# Patient Record
Sex: Female | Born: 1965
Health system: Southern US, Community
[De-identification: ages and names within clinical notes are randomized; demographics above are authoritative.]

## PROBLEM LIST (undated history)

## (undated) DIAGNOSIS — R519 Headache, unspecified: Secondary | ICD-10-CM

## (undated) DIAGNOSIS — G43909 Migraine, unspecified, not intractable, without status migrainosus: Secondary | ICD-10-CM

## (undated) DIAGNOSIS — F329 Major depressive disorder, single episode, unspecified: Secondary | ICD-10-CM

## (undated) DIAGNOSIS — R51 Headache: Secondary | ICD-10-CM

## (undated) DIAGNOSIS — F32A Depression, unspecified: Secondary | ICD-10-CM

## (undated) HISTORY — DX: Headache: R51

## (undated) HISTORY — PX: OTHER SURGICAL HISTORY: SHX169

## (undated) HISTORY — DX: Depression, unspecified: F32.A

## (undated) HISTORY — DX: Major depressive disorder, single episode, unspecified: F32.9

## (undated) HISTORY — DX: Migraine, unspecified, not intractable, without status migrainosus: G43.909

## (undated) HISTORY — DX: Headache, unspecified: R51.9

---

## 2016-04-21 ENCOUNTER — Ambulatory Visit: Payer: Self-pay | Admitting: Family Medicine

## 2016-05-02 ENCOUNTER — Encounter: Payer: Self-pay | Admitting: Family Medicine

## 2016-05-02 ENCOUNTER — Ambulatory Visit (INDEPENDENT_AMBULATORY_CARE_PROVIDER_SITE_OTHER): Payer: PRIVATE HEALTH INSURANCE | Admitting: Family Medicine

## 2016-05-02 VITALS — BP 112/86 | HR 64 | Temp 98.4°F | Ht 64.0 in | Wt 134.4 lb

## 2016-05-02 DIAGNOSIS — G43809 Other migraine, not intractable, without status migrainosus: Secondary | ICD-10-CM | POA: Diagnosis not present

## 2016-05-02 DIAGNOSIS — R590 Localized enlarged lymph nodes: Secondary | ICD-10-CM | POA: Diagnosis not present

## 2016-05-02 DIAGNOSIS — Z7689 Persons encountering health services in other specified circumstances: Secondary | ICD-10-CM | POA: Diagnosis not present

## 2016-05-02 DIAGNOSIS — Z8659 Personal history of other mental and behavioral disorders: Secondary | ICD-10-CM | POA: Diagnosis not present

## 2016-05-02 NOTE — Progress Notes (Signed)
Patient ID: Jill Price, female   DOB: May 07, 1965, 51 y.o.   MRN: [id number]  Patient presents to clinic today to establish care. She would like to schedule for a physical and lab work within one to two weeks.   Chronic Issues:  Migraines:  Intermittent and reports that these have been associated with stress in the past.  She reports using Excedrin migraine and coffee no more than once/month at the most.  History of an aura with seeing "broken glass" with photophobia presents with migraines. She denies a headache at this time but reports that she treated yesterday as she "felt one coming on". She denies any visual changes, numbness, tingling, weakness, N/V, photophobia or phonophobia.  Lymphadenopathy: Cervical lymphadenopathy noted on the right side of her neck which she reports has been present for "many years" She denies any changes in size, tenderness, fever, sweats, night sweats, or unexplained weight loss. She reports a previous work up for this issue. She recently relocated to [facility] from [place] and has requested her medical records to be sent to this office.   History of depression: She denies use of medications and reports that depression symptoms are episodic in nature. She denies depressed or anxious mood today. She does report increased stress due to recent move and looking for employment.   Health Maintenance: Dental -- Every 6 months Vision -- Yearly Immunizations --She has not had a flu vaccine. She declines it today.  Colonoscopy -- Needed Mammogram --She is due; Last one in 12/2014; Normal per patient PAP -- She is due; Last one in 12/2014; Normal per patient  Walks every day for 45 minutes/day without cardiopulmonary symptoms. She avoids dairy and pasta. She follows a modified carbohydrate diet. Caffeine intake 2 cups/day.  Past Medical History:  Diagnosis Date  . Depression   . Frequent headaches   . Migraines     History reviewed. No pertinent surgical history.  No  current outpatient prescriptions on file prior to visit.   No current facility-administered medications on file prior to visit.     No Known Allergies  History reviewed. No pertinent family history.  Social History   Social History  . Marital status: Unknown    Spouse name: N/A  . Number of children: N/A  . Years of education: N/A   Occupational History  . Not on file.   Social History Main Topics  . Smoking status: Never Smoker  . Smokeless tobacco: Never Used  . Alcohol use No  . Drug use: No  . Sexual activity: Yes    Birth control/ protection: None   Other Topics Concern  . Not on file   Social History Narrative  . No narrative on file    Review of Systems  Constitutional: Negative for chills, diaphoresis, fever, malaise/fatigue and weight loss.  Eyes: Negative for blurred vision, double vision and photophobia.  Respiratory: Negative for cough, sputum production and shortness of breath.   Cardiovascular: Negative for chest pain, palpitations, claudication and leg swelling.  Gastrointestinal: Negative for abdominal pain, constipation, diarrhea, heartburn, nausea and vomiting.  Genitourinary: Negative for dysuria, hematuria and urgency.  Musculoskeletal: Negative for myalgias.  Skin: Negative for rash.  Neurological: Negative for dizziness, tingling and headaches.  Psychiatric/Behavioral:       Denies depressed or anxious mood today   Past Medical History:  Diagnosis Date  . Depression   . Frequent headaches   . Migraines      Social History   Social History  . Marital  status: Unknown    Spouse name: N/A  . Number of children: N/A  . Years of education: N/A   Occupational History  . Not on file.   Social History Main Topics  . Smoking status: Never Smoker  . Smokeless tobacco: Never Used  . Alcohol use No  . Drug use: No  . Sexual activity: Yes    Birth control/ protection: None   Other Topics Concern  . 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 No pertinent surgical history.  Family History  Problem Relation Age of Onset  . Heart disease Father   . Dementia Father     No Known Allergies  No current outpatient prescriptions on file prior to visit.   No current facility-administered medications on file prior to visit.     BP 112/86 (BP Location: Left Arm, Patient Position: Sitting, Cuff Size: Normal)   Pulse 64   Temp 98.4 F (36.9 C) (Oral)   Ht 5\' 4"  (1.626 m)   Wt 134 lb 6.4 oz (61 kg)   LMP 04/26/2016 (Exact Date)   SpO2 99%   BMI 23.07 kg/m      BP 112/86 (BP Location: Left Arm, Patient Position: Sitting, Cuff Size: Normal)   Pulse 64   Temp 98.4 F (36.9 C) (Oral)   Ht 5\' 4"  (1.626 m)   Wt 134 lb 6.4 oz (61 kg)   LMP 04/26/2016 (Exact Date)   SpO2 99%   BMI 23.07 kg/m   Physical Exam  Constitutional: She is oriented to person, place, and time and well-developed, well-nourished, and in no distress.  HENT:  Head: Normocephalic.  Right Ear: Tympanic membrane normal.  Left Ear: Tympanic membrane normal.  Nose: No rhinorrhea. Right sinus exhibits no maxillary sinus tenderness and no frontal sinus tenderness. Left sinus exhibits no maxillary sinus tenderness and no frontal sinus tenderness.  Mouth/Throat: Oropharynx is clear and moist and mucous membranes are normal. No oropharyngeal exudate or posterior oropharyngeal erythema.  Eyes: Pupils are equal, round, and reactive to light. No scleral icterus.  Neck: Neck supple.  Non tender, movable, right sided anterior cervical lymph node approximately 2 cm x 2cm  Cardiovascular: Normal rate, regular rhythm and intact distal pulses.   Pulmonary/Chest: Effort normal and breath sounds normal. She has no wheezes. She has no rales.  Abdominal: Soft. Bowel sounds are normal. There is no tenderness.  Lymphadenopathy:    She has cervical adenopathy.  Neurological: She is alert and  oriented to person, place, and time. Coordination normal.  II-Visual fields grossly intact. III/IV/VI-Extraocular movements intact. Pupils reactive bilaterally. V/VII-Smile symmetric, equal eyebrow raise, facial sensation intact VIII- Hearing grossly intact Ambulates with a coordinated gait   Skin: Skin is warm and dry. No rash noted.  Psychiatric: Mood, memory, affect and judgment normal.    Assessment/Plan: 1. Lymphadenopathy, cervical History of cervical adenopathy per patient; will return for lab work; no history of unexplained weight loss or night sweats. Will monitor at follow up in one week with physical and lab work.We discussed [place] of lymph node and patient stated she will obtain medical records from [place] where she was evaluated for this and bring records with her at her physical and lab work next week.  2. Other migraine without status migrainosus, not intractable Controlled; Migraines are episodic in nature and are managed with excedrin migraine. We discussed the importance of follow up if migraines are  occurring more frequently and if she is treating these 10 episodes a month or if new symptoms appear such as neurological changes.   3. History of depression Controlled; Episodic in nature; No history of medication for treatment. She prefers counseling for treatment.  4. Encounter to establish care We reviewed the PMH, PSH, FH, SH, Meds and Allergies. -We provided refills for any medications we will prescribe as needed. -We addressed current concerns per orders and patient instructions. -We have asked for records for pertinent exams, studies, vaccines and notes from previous providers. -We have advised patient to follow up per instructions below.    Advised follow up for a physical and lab work in one week with copies of medical records.  We also discussed preventive screenings that are recommended such as colon and skin cancer screenings. Follow up with physical. She voiced  understanding and agreed with plan.  Delano Metz, FNP-C

## 2016-05-02 NOTE — Patient Instructions (Signed)
It was a pleasure to meet you today! Please follow up for blood work and physical at your convenience.

## 2016-05-02 NOTE — Progress Notes (Signed)
Pre visit review using our clinic review tool, if applicable. No additional management support is needed unless otherwise documented below in the visit note. 

## 2016-05-06 ENCOUNTER — Other Ambulatory Visit (INDEPENDENT_AMBULATORY_CARE_PROVIDER_SITE_OTHER): Payer: PRIVATE HEALTH INSURANCE

## 2016-05-06 DIAGNOSIS — Z0000 Encounter for general adult medical examination without abnormal findings: Secondary | ICD-10-CM | POA: Diagnosis not present

## 2016-05-06 LAB — CBC WITH DIFFERENTIAL/PLATELET
Basophils Absolute: 0.1 K/uL (ref 0.0–0.1)
Basophils Relative: 0.9 % (ref 0.0–3.0)
Eosinophils Absolute: 0.4 K/uL (ref 0.0–0.7)
Eosinophils Relative: 6.5 % — ABNORMAL HIGH (ref 0.0–5.0)
HCT: 38.6 % (ref 36.0–46.0)
Hemoglobin: 13 g/dL (ref 12.0–15.0)
Lymphocytes Relative: 29 % (ref 12.0–46.0)
Lymphs Abs: 1.7 K/uL (ref 0.7–4.0)
MCHC: 33.8 g/dL (ref 30.0–36.0)
MCV: 88 fl (ref 78.0–100.0)
Monocytes Absolute: 0.4 K/uL (ref 0.1–1.0)
Monocytes Relative: 7.1 % (ref 3.0–12.0)
Neutro Abs: 3.3 K/uL (ref 1.4–7.7)
Neutrophils Relative %: 56.5 % (ref 43.0–77.0)
Platelets: 246 K/uL (ref 150.0–400.0)
RBC: 4.39 Mil/uL (ref 3.87–5.11)
RDW: 13.2 % (ref 11.5–15.5)
WBC: 5.9 K/uL (ref 4.0–10.5)

## 2016-05-06 LAB — HEPATIC FUNCTION PANEL
ALK PHOS: 64 U/L (ref 39–117)
ALT: 15 U/L (ref 0–35)
AST: 14 U/L (ref 0–37)
Albumin: 4.2 g/dL (ref 3.5–5.2)
BILIRUBIN DIRECT: 0.2 mg/dL (ref 0.0–0.3)
TOTAL PROTEIN: 6.9 g/dL (ref 6.0–8.3)
Total Bilirubin: 1 mg/dL (ref 0.2–1.2)

## 2016-05-06 LAB — LIPID PANEL
Cholesterol: 194 mg/dL (ref 0–200)
HDL: 79.8 mg/dL
LDL Cholesterol: 102 mg/dL — ABNORMAL HIGH (ref 0–99)
NonHDL: 114.05
Total CHOL/HDL Ratio: 2
Triglycerides: 61 mg/dL (ref 0.0–149.0)
VLDL: 12.2 mg/dL (ref 0.0–40.0)

## 2016-05-06 LAB — BASIC METABOLIC PANEL
BUN: 13 mg/dL (ref 6–23)
CALCIUM: 9.1 mg/dL (ref 8.4–10.5)
CO2: 28 mEq/L (ref 19–32)
CREATININE: 0.69 mg/dL (ref 0.40–1.20)
Chloride: 104 mEq/L (ref 96–112)
GFR: 94.86 mL/min (ref >60.00)
Glucose, Bld: 88 mg/dL (ref 70–99)
Potassium: 4.4 mEq/L (ref 3.5–5.1)
Sodium: 137 mEq/L (ref 135–145)

## 2016-05-06 LAB — POC URINALSYSI DIPSTICK (AUTOMATED)
Bilirubin, UA: NEGATIVE
Blood, UA: NEGATIVE
Glucose, UA: NEGATIVE
KETONES UA: NEGATIVE
LEUKOCYTES UA: NEGATIVE
Nitrite, UA: NEGATIVE
PROTEIN UA: NEGATIVE
Spec Grav, UA: 1.02
UROBILINOGEN UA: 0.2
pH, UA: 6

## 2016-05-06 LAB — TSH: TSH: 1.22 u[IU]/mL (ref 0.35–4.50)

## 2016-05-12 ENCOUNTER — Encounter: Payer: Self-pay | Admitting: Family Medicine

## 2016-05-12 ENCOUNTER — Ambulatory Visit (INDEPENDENT_AMBULATORY_CARE_PROVIDER_SITE_OTHER): Payer: PRIVATE HEALTH INSURANCE | Admitting: Family Medicine

## 2016-05-12 ENCOUNTER — Other Ambulatory Visit (HOSPITAL_COMMUNITY)
Admission: RE | Admit: 2016-05-12 | Discharge: 2016-05-12 | Disposition: A | Discharge status: Home / Self Care | Payer: 59 | Source: Ambulatory Visit | Attending: Family Medicine | Admitting: Family Medicine

## 2016-05-12 ENCOUNTER — Telehealth: Payer: Self-pay | Admitting: Family Medicine

## 2016-05-12 DIAGNOSIS — Z01419 Encounter for gynecological examination (general) (routine) without abnormal findings: Secondary | ICD-10-CM | POA: Insufficient documentation

## 2016-05-12 DIAGNOSIS — Z1283 Encounter for screening for malignant neoplasm of skin: Secondary | ICD-10-CM

## 2016-05-12 DIAGNOSIS — Z0000 Encounter for general adult medical examination without abnormal findings: Secondary | ICD-10-CM

## 2016-05-12 DIAGNOSIS — R590 Localized enlarged lymph nodes: Secondary | ICD-10-CM

## 2016-05-12 NOTE — Progress Notes (Signed)
Pre visit review using our clinic review tool, if applicable. No additional management support is needed unless otherwise documented below in the visit note. 

## 2016-05-12 NOTE — Telephone Encounter (Signed)
Pt returning your call. Please call back °

## 2016-05-12 NOTE — Telephone Encounter (Signed)
Spoke with pt and she was in a meeting. She asked for call back on Friday.

## 2016-05-12 NOTE — Patient Instructions (Signed)
It was a pleasure to see you today! A referral to dermatology has been placed for you and please contact office in 10 working days if you have not heard from our office for an appointment.  We recommend the following healthy lifestyle measures: - eat a healthy whole foods diet consisting of regular small meals composed of vegetables, fruits, beans, nuts, seeds, healthy meats such as white chicken and fish and whole grains.  - avoid sweets, white starchy foods, fried foods, fast food, processed foods, sodas, red meet and other fattening foods.  - get a least 150-300 minutes of aerobic exercise per week.   Health Maintenance, Female Introduction Adopting a healthy lifestyle and getting preventive care can go a long way to promote health and wellness. Talk with your health care provider about what schedule of regular examinations is right for you. This is a good chance for you to check in with your provider about disease prevention and staying healthy. In between checkups, there are plenty of things you can do on your own. Experts have done a lot of research about which lifestyle changes and preventive measures are most likely to keep you healthy. Ask your health care provider for more information. Weight and diet Eat a healthy diet  Be sure to include plenty of vegetables, fruits, low-fat dairy products, and lean protein.  Do not eat a lot of foods high in solid fats, added sugars, or salt.  Get regular exercise. This is one of the most important things you can do for your health.  Most adults should exercise for at least 150 minutes each week. The exercise should increase your heart rate and make you sweat (moderate-intensity exercise).  Most adults should also do strengthening exercises at least twice a week. This is in addition to the moderate-intensity exercise. Maintain a healthy weight  Body mass index (BMI) is a measurement that can be used to identify possible weight problems. It  estimates body fat based on height and weight. Your health care provider can help determine your BMI and help you achieve or maintain a healthy weight.  For females 20 years of age and older:  A BMI below 18.5 is considered underweight.  A BMI of 18.5 to 24.9 is normal.  A BMI of 25 to 29.9 is considered overweight.  A BMI of 30 and above is considered obese. Watch levels of cholesterol and blood lipids  You should start having your blood tested for lipids and cholesterol at 53 years of age, then have this test every 5 years.  You may need to have your cholesterol levels checked more often if:  Your lipid or cholesterol levels are high.  You are older than 53 years of age.  You are at high risk for heart disease. Cancer screening Lung Cancer  Lung cancer screening is recommended for adults 55-80 years old who are at high risk for lung cancer because of a history of smoking.  A yearly low-dose CT scan of the lungs is recommended for people who:  Currently smoke.  Have quit within the past 15 years.  Have at least a 30-pack-year history of smoking. A pack year is smoking an average of one pack of cigarettes a day for 1 year.  Yearly screening should continue until it has been 15 years since you quit.  Yearly screening should stop if you develop a health problem that would prevent you from having lung cancer treatment. Breast Cancer  Practice breast self-awareness. This means understanding how your   breasts normally appear and feel.  It also means doing regular breast self-exams. Let your health care provider know about any changes, no matter how small.  If you are in your 20s or 30s, you should have a clinical breast exam (CBE) by a health care provider every 1-3 years as part of a regular health exam.  If you are 53 or older, have a CBE every year. Also consider having a breast X-ray (mammogram) every year.  If you have a family history of breast cancer, talk to your  health care provider about genetic screening.  If you are at high risk for breast cancer, talk to your health care provider about having an MRI and a mammogram every year.  Breast cancer gene (BRCA) assessment is recommended for women who have family members with BRCA-related cancers. BRCA-related cancers include:  Breast.  Ovarian.  Tubal.  Peritoneal cancers.  Results of the assessment will determine the need for genetic counseling and BRCA1 and BRCA2 testing. Cervical Cancer  Your health care provider may recommend that you be screened regularly for cancer of the pelvic organs (ovaries, uterus, and vagina). This screening involves a pelvic examination, including checking for microscopic changes to the surface of your cervix (Pap test). You may be encouraged to have this screening done every 3 years, beginning at age 56.  For women ages 102-65, health care providers may recommend pelvic exams and Pap testing every 3 years, or they may recommend the Pap and pelvic exam, combined with testing for human papilloma virus (HPV), every 5 years. Some types of HPV increase your risk of cervical cancer. Testing for HPV may also be done on women of any age with unclear Pap test results.  Other health care providers may not recommend any screening for nonpregnant women who are considered low risk for pelvic cancer and who do not have symptoms. Ask your health care provider if a screening pelvic exam is right for you.  If you have had past treatment for cervical cancer or a condition that could lead to cancer, you need Pap tests and screening for cancer for at least 20 years after your treatment. If Pap tests have been discontinued, your risk factors (such as having a new sexual partner) need to be reassessed to determine if screening should resume. Some women have medical problems that increase the chance of getting cervical cancer. In these cases, your health care provider may recommend more frequent  screening and Pap tests. Colorectal Cancer  This type of cancer can be detected and often prevented.  Routine colorectal cancer screening usually begins at 53 years of age and continues through 53 years of age.  Your health care provider may recommend screening at an earlier age if you have risk factors for colon cancer.  Your health care provider may also recommend using home test kits to check for hidden blood in the stool.  A small camera at the end of a tube can be used to examine your colon directly (sigmoidoscopy or colonoscopy). This is done to check for the earliest forms of colorectal cancer.  Routine screening usually begins at age 61.  Direct examination of the colon should be repeated every 5-10 years through 53 years of age. However, you may need to be screened more often if early forms of precancerous polyps or small growths are found. Skin Cancer  Check your skin from head to toe regularly.  Tell your health care provider about any new moles or changes in moles, especially  if there is a change in a mole's shape or color.  Also tell your health care provider if you have a mole that is larger than the size of a pencil eraser.  Always use sunscreen. Apply sunscreen liberally and repeatedly throughout the day.  Protect yourself by wearing long sleeves, pants, a wide-brimmed hat, and sunglasses whenever you are outside. Heart disease, diabetes, and high blood pressure  High blood pressure causes heart disease and increases the risk of stroke. High blood pressure is more likely to develop in:  People who have blood pressure in the high end of the normal range (130-139/85-89 mm Hg).  People who are overweight or obese.  People who are African American.  If you are 18-39 years of age, have your blood pressure checked every 3-5 years. If you are 40 years of age or older, have your blood pressure checked every year. You should have your blood pressure measured twice-once  when you are at a hospital or clinic, and once when you are not at a hospital or clinic. Record the average of the two measurements. To check your blood pressure when you are not at a hospital or clinic, you can use:  An automated blood pressure machine at a pharmacy.  A home blood pressure monitor.  If you are between 55 years and 79 years old, ask your health care provider if you should take aspirin to prevent strokes.  Have regular diabetes screenings. This involves taking a blood sample to check your fasting blood sugar level.  If you are at a normal weight and have a low risk for diabetes, have this test once every three years after 53 years of age.  If you are overweight and have a high risk for diabetes, consider being tested at a younger age or more often. Preventing infection Hepatitis B  If you have a higher risk for hepatitis B, you should be screened for this virus. You are considered at high risk for hepatitis B if:  You were born in a country where hepatitis B is common. Ask your health care provider which countries are considered high risk.  Your parents were born in a high-risk country, and you have not been immunized against hepatitis B (hepatitis B vaccine).  You have HIV or AIDS.  You use needles to inject street drugs.  You live with someone who has hepatitis B.  You have had sex with someone who has hepatitis B.  You get hemodialysis treatment.  You take certain medicines for conditions, including cancer, organ transplantation, and autoimmune conditions. Hepatitis C  Blood testing is recommended for:  Everyone born from 1945 through 1965.  Anyone with known risk factors for hepatitis C. Sexually transmitted infections (STIs)  You should be screened for sexually transmitted infections (STIs) including gonorrhea and chlamydia if:  You are sexually active and are younger than 53 years of age.  You are older than 53 years of age and your health care  provider tells you that you are at risk for this type of infection.  Your sexual activity has changed since you were last screened and you are at an increased risk for chlamydia or gonorrhea. Ask your health care provider if you are at risk.  If you do not have HIV, but are at risk, it may be recommended that you take a prescription medicine daily to prevent HIV infection. This is called pre-exposure prophylaxis (PrEP). You are considered at risk if:  You are sexually active and do not   regularly use condoms or know the HIV status of your partner(s).  You take drugs by injection.  You are sexually active with a partner who has HIV. Talk with your health care provider about whether you are at high risk of being infected with HIV. If you choose to begin PrEP, you should first be tested for HIV. You should then be tested every 3 months for as long as you are taking PrEP. Pregnancy  If you are premenopausal and you may become pregnant, ask your health care provider about preconception counseling.  If you may become pregnant, take 400 to 800 micrograms (mcg) of folic acid every day.  If you want to prevent pregnancy, talk to your health care provider about birth control (contraception). Osteoporosis and menopause  Osteoporosis is a disease in which the bones lose minerals and strength with aging. This can result in serious bone fractures. Your risk for osteoporosis can be identified using a bone density scan.  If you are 65 years of age or older, or if you are at risk for osteoporosis and fractures, ask your health care provider if you should be screened.  Ask your health care provider whether you should take a calcium or vitamin D supplement to lower your risk for osteoporosis.  Menopause may have certain physical symptoms and risks.  Hormone replacement therapy may reduce some of these symptoms and risks. Talk to your health care provider about whether hormone replacement therapy is right  for you. Follow these instructions at home:  Schedule regular health, dental, and eye exams.  Stay current with your immunizations.  Do not use any tobacco products including cigarettes, chewing tobacco, or electronic cigarettes.  If you are pregnant, do not drink alcohol.  If you are breastfeeding, limit how much and how often you drink alcohol.  Limit alcohol intake to no more than 1 drink per day for nonpregnant women. One drink equals 12 ounces of beer, 5 ounces of wine, or 1 ounces of hard liquor.  Do not use street drugs.  Do not share needles.  Ask your health care provider for help if you need support or information about quitting drugs.  Tell your health care provider if you often feel depressed.  Tell your health care provider if you have ever been abused or do not feel safe at home. This information is not intended to replace advice given to you by your health care provider. Make sure you discuss any questions you have with your health care provider. Document Released: 10/04/2010 Document Revised: 08/27/2015 Document Reviewed: 12/23/2014  2017 [employer]  

## 2016-05-12 NOTE — Progress Notes (Signed)
Subjective:    Patient ID: Jill Price, female    DOB: December 16, 1965, 51 y.o.   MRN: [id number]  HPI  Jill Price is a 51 year old female who is a nonsmoker and presents today for routine care.  Cervical lymphadenopathy:  History of right cervical adenopathy per patient; CBC WNL today; no history of unexplained weight loss or night sweats.  She states that this has been present for at least a decade and no changes have been noted.  She declines further testing of [place] and states that she will obtain her medical records from [place] as she has moved here recently.   History of migraine:  Controlled.  She denies recent HA and she has not had one since last visit at this office.  Denies depressed or anxious mood.  No history of medication for treatment. She prefers counseling for treatment if needed. PHQ-2:  O    Review of Systems Constitutional: No fever, chills, significant weight change, fatigue, weakness or night sweats Eyes: No redness, discharge, pain, blurred vision, double vision, or loss of vision ENT/mouth: No nasal congestion, postnasal drainage,epistaxis, purulent discharge, earache, hearing loss, tinnitus ,sore throat , dental pain, or hoarseness   Cardiovascular: no chest pain, palpitations, racing, irregular rhythm, syncope, nausea, sweating, claudication, or edema  Respiratory: No cough, sputum production,hemoptysis,  dyspnea, paroxysmal nocturnal dyspnea, pleuritic chest pain, significant snoring, or  apnea    Gastrointestinal: No heartburn,dysphagia, nausea and vomiting,ominal pain, change in bowels, anorexia, diarrhea, significant constipation, rectal bleeding, melena,  stool incontinence or jaundice Genitourinary: No dysuria,hematuria, pyuria, frequency, urgency,  incontinence, nocturia, dark urine or flank pain Musculoskeletal: No myalgias or muscle cramping, joint stiffness, joint swelling, joint color change, weakness, or cyanosis Dermatologic: No rash, pruritus,  urticaria, or change in color or temperature of skin Neurologic: No headache, vertigo, limb weakness, tremor, gait disturbance, seizures, memory loss, numbness or tingling Psychiatric: No significant anxiety or depression, anhedonia, panic attacks, insomnia, or anorexia Endocrine: No change in hair/skin/ nails, excessive thirst, excessive hunger, excessive urination, or unexplained fatigue Hematologic/lymphatic: No bruising, lymphadenopathy,or  abnormal clotting Allergy/immunology: No itchy/ watery eyes, abnormal sneezing, rhinitis, urticaria ,or angioedema     Objective:   Physical Exam Physical Exam  Constitutional: She is oriented to person, place, and time. She appears well-developed and well-nourished. No distress.  HENT:  Head: Normocephalic and atraumatic.  Right Ear: Tympanic membrane and ear canal normal.  Left Ear: Tympanic membrane and ear canal normal.  Mouth/Throat: Oropharynx is clear and moist.  Eyes: Pupils are equal, round, and reactive to light. No scleral icterus.  Neck: Normal range of motion. No thyromegaly present.  Cardiovascular: Normal rate and regular rhythm.   No murmur heard. Pulmonary/Chest: Effort normal and breath sounds normal. No respiratory distress. He has no wheezes. She has no rales. She exhibits no tenderness.  Abdominal: Soft. Bowel sounds are normal. She exhibits no distension and no mass. There is no tenderness. There is no rebound and no guarding.  Musculoskeletal: She exhibits no edema.  Lymphadenopathy:    Non tender, movable right anterior cervical lymph node approximately 2 cm x 34m. No change from previous visit.  Neurological: She is alert and oriented to person, place, and time. She has normal patellar reflexes. She exhibits normal muscle tone. Coordination normal.  Skin: Skin is warm and dry.  Psychiatric: She has a normal mood and affect. Her behavior is normal. Judgment and thought content normal.  Breasts: Examined lying Right: Without  masses, retractions, discharge or  axillary adenopathy.  Left: Without masses, retractions, discharge or axillary adenopathy.  Inguinal/mons: Normal without inguinal adenopathy  External genitalia: Normal  BUS/Urethra/Skene's glands: Normal  Bladder: Normal  Vagina: Normal  Cervix: Normal  Uterus: normal in size, shape and contour. Midline and mobile  Adnexa/parametria:  Rt: Without masses or tenderness.  Lt: Without masses or tenderness.  Anus and perineum: Normal      Assessment & Plan:  1. Routine general medical examination at a health care facility 51 y.o. female presenting for annual physical.  Health Maintenance counseling: 1. Anticipatory guidance: Patient counseled regarding regular dental exams, eye exams, wearing seatbelts.  2. Risk factor reduction:  Advised patient of need for regular exercise and diet rich and fruits and vegetables to reduce risk of heart attack and stroke.  3. Immunizations/screenings/ancillary studies: Patient declines vaccines. Declined influenza and tetanus today.  There is no immunization history on file for this patient. Health Maintenance Due  Topic Date Due  . Hepatitis C Screening  June 28, 1965  . HIV Screening  02/02/1979  . TETANUS/TDAP  02/02/1983  . PAP SMEAR  02/01/1985  . COLONOSCOPY  02/01/2014   4. Cervical cancer screening- PAP completed today; Last PAP normal per patient; she is having records sent to this office. 5. Breast cancer screening-  breast exam today: No masses or abnormal findings today;  and mammogram: 04/2016: no evidence of malignancy. Recommendation: Yearly return 6. Colon cancer screening - Discussed and recommended cologard for screening. Patient will consider this option. 7. Skin cancer screening- Referral to dermatology per patient request. Limited skin exam including arms, legs, back, face, and scalp completed   2. Screening for skin cancer Patient requested referral to dermatology; moved here from [place] and she has  consistently been followed by dermatology for yearly checks. - Ambulatory referral to Dermatology  3. LAD (lymphadenopathy) of right cervical region History cervical adenopathy per patient; Exam and history are reassuring; She declines further testing for evaluation. We discussed the importance of monitoring for any changes, unexplained weight loss, fever, chills, sweats.  Patient has requested information for a chiropractor for back adjustments. She does not have back pain or concerns. We discussed options for treatment if she develops back pain such as PT or sports medicine referral. Nicoletta Dress is an option for chiropractic care as she is seeking this type of care.  Follow up in one year or sooner.  Delano Metz, FNP-C

## 2016-05-13 LAB — CYTOLOGY - PAP
ADEQUACY: ABSENT
DIAGNOSIS: NEGATIVE

## 2016-05-13 NOTE — Telephone Encounter (Signed)
Patient returned the call in the office and was provided with information per Gregary Signs and verbalized understanding. Patient  requested to have the information about Colon cancer mailed to her which was done.

## 2016-06-02 ENCOUNTER — Other Ambulatory Visit: Payer: Self-pay

## 2016-06-02 DIAGNOSIS — Z1239 Encounter for other screening for malignant neoplasm of breast: Secondary | ICD-10-CM

## 2016-06-08 ENCOUNTER — Other Ambulatory Visit: Payer: Self-pay | Admitting: Family Medicine

## 2016-06-08 DIAGNOSIS — Z1231 Encounter for screening mammogram for malignant neoplasm of breast: Secondary | ICD-10-CM

## 2016-06-27 ENCOUNTER — Ambulatory Visit: Payer: PRIVATE HEALTH INSURANCE

## 2016-07-14 ENCOUNTER — Ambulatory Visit
Admission: RE | Admit: 2016-07-14 | Discharge: 2016-07-14 | Disposition: A | Discharge status: Home / Self Care | Payer: BC Managed Care – PPO | Source: Ambulatory Visit | Attending: Family Medicine | Admitting: Family Medicine

## 2016-07-14 DIAGNOSIS — Z1231 Encounter for screening mammogram for malignant neoplasm of breast: Secondary | ICD-10-CM

## 2016-09-28 ENCOUNTER — Telehealth: Payer: Self-pay | Admitting: Family Medicine

## 2016-09-28 NOTE — Telephone Encounter (Signed)
° ° °  Pt would like a call back concerning her health and would like a call back. She said she prefer to speak with [facility] [phone]

## 2016-09-29 ENCOUNTER — Ambulatory Visit (INDEPENDENT_AMBULATORY_CARE_PROVIDER_SITE_OTHER): Payer: BC Managed Care – PPO | Admitting: Family Medicine

## 2016-09-29 ENCOUNTER — Encounter: Payer: Self-pay | Admitting: Family Medicine

## 2016-09-29 VITALS — BP 110/78 | HR 88 | Temp 98.4°F | Wt 135.8 lb

## 2016-09-29 DIAGNOSIS — Z7251 High risk heterosexual behavior: Secondary | ICD-10-CM

## 2016-09-29 DIAGNOSIS — N951 Menopausal and female climacteric states: Secondary | ICD-10-CM | POA: Diagnosis not present

## 2016-09-29 LAB — POCT URINE PREGNANCY: Preg Test, Ur: NEGATIVE

## 2016-09-29 NOTE — Progress Notes (Signed)
Subjective:    Patient ID: Jill Price, female    DOB: [birth year], 51 y.o.   MRN: [id number]  HPI  Ms. Jill Price is a 51 year old female who is here today to be evaluated for possible pregnancy. She reports having intercourse over 2 weeks ago and noted a condom failure. She is very concerned, anxious, and is teary due this incident. Her last menstrual cycle is at least 4 to 5 months ago. She states she is aware this is "very unlikely" however would like a pregnancy test today. She denies any breast tenderness, vaginal discharge, or frequency of urination.  Partners: 3 total lifetime; female; one recent Prevention: Condom use consistently STI: Condom use consistently; states partner informed her he is negative for STIs Practices: Vaginal intercourse; female partner Past history of STI: None  She state she is not interested in STI screening today as this is not an issue. She also requests having additional testing for hormone levels due to transition to menopause and risk of pregnancy at this stage in her life.  Review of Systems  Constitutional: Negative for chills, fatigue and fever.  Respiratory: Negative for cough, shortness of breath and wheezing.   Cardiovascular: Negative for chest pain and palpitations.  Gastrointestinal: Negative for abdominal pain, constipation, diarrhea, nausea and vomiting.  Genitourinary: Negative for dyspareunia, dysuria, frequency, genital sores, hematuria, pelvic pain, urgency, vaginal bleeding, vaginal discharge and vaginal pain.  Musculoskeletal: Negative for myalgias.  Skin: Negative for rash.  Neurological: Negative for dizziness, weakness, light-headedness and headaches.  Psychiatric/Behavioral:       Denies depressed mood. Anxious today due condom failure   Past Medical History:  Diagnosis Date  . Depression   . Frequent headaches   . Migraines      Social History   Social History  . Marital status: Unknown    Spouse name: N/A  . Number  of children: N/A  . Years of education: N/A   Occupational History  . Not on file.   Social History Main Topics  . Smoking status: Never Smoker  . Smokeless tobacco: Never Used  . Alcohol use No  . Drug use: No  . Sexual activity: Yes    Birth control/ protection: None   Other Topics Concern  . 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  Family History  Problem Relation Age of Onset  . Heart disease Father   . Dementia Father   . Breast cancer Neg Hx     No Known Allergies  No current outpatient prescriptions on file prior to visit.   No current facility-administered medications on file prior to visit.     BP 110/78 (BP Location: Left Arm, Patient Position: Sitting, Cuff Size: Normal)   Pulse 88   Temp 98.4 F (36.9 C) (Oral)   Wt 135 lb 12.8 oz (61.6 kg)   SpO2 99%   BMI 23.31 kg/m       Objective:   Physical Exam  Constitutional: She is oriented to person, place, and time. She appears well-developed and well-nourished.  Eyes: Pupils are equal, round, and reactive to light. No scleral icterus.  Neck: Neck supple.  Cardiovascular: Normal rate, regular rhythm and intact distal pulses.   Pulmonary/Chest: Effort normal and breath sounds normal.  Abdominal: Soft. Bowel sounds are normal. There is no tenderness.  Musculoskeletal: Normal range of motion. She exhibits no edema.  Lymphadenopathy:    She has no cervical adenopathy.  Neurological: She is alert and oriented to person, place, and time.  Skin: Skin is warm and dry. No rash noted.       Assessment & Plan:  1. Unprotected sexual intercourse Will complete pregnancy test today; she declined STI screening today; we discussed that likelihood of pregnancy is quite low however more important concern relates to risk of exposure to STIs. We reviewed importance of consistent condom use and also advised her to consider testing.  She voiced understanding and stated that she will consider this if needed. - POCT urine pregnancy  2. Perimenopause Menstrual cycle changes consistent with transition to menopause. As she is requesting hormonal testing; refer to gynecology for further evaluation and treatment. - Ambulatory referral to Gynecology  Follow up as needed.  Delano Metz, FNP-C

## 2016-09-29 NOTE — Patient Instructions (Signed)
We have ordered labs or studies at this visit. It can take up to 1-2 weeks for results and processing. IF results require follow up or explanation, we will call you with instructions. Clinically stable results will be released to your [facility]. If you have not heard from [place] or cannot find your results in [facility] in 2 weeks please contact our office at [phone].  If you are not yet signed up for [facility], please consider signing up   You will be contact about your referral.  Please let us know if you have not heard back within 1 week about your referral.

## 2016-10-07 ENCOUNTER — Ambulatory Visit: Payer: Self-pay | Admitting: Obstetrics & Gynecology

## 2016-10-10 ENCOUNTER — Ambulatory Visit (INDEPENDENT_AMBULATORY_CARE_PROVIDER_SITE_OTHER): Payer: BC Managed Care – PPO | Admitting: Obstetrics & Gynecology

## 2016-10-10 ENCOUNTER — Encounter: Payer: Self-pay | Admitting: Obstetrics & Gynecology

## 2016-10-10 VITALS — BP 106/70 | Ht 65.0 in | Wt 137.0 lb

## 2016-10-10 DIAGNOSIS — N914 Secondary oligomenorrhea: Secondary | ICD-10-CM

## 2016-10-10 DIAGNOSIS — Z30011 Encounter for initial prescription of contraceptive pills: Secondary | ICD-10-CM | POA: Diagnosis not present

## 2016-10-10 MED ORDER — NORETHINDRONE 0.35 MG PO TABS: 1.0000 | ORAL_TABLET | Freq: Every day | ORAL | 4 refills | Status: DC

## 2016-10-10 NOTE — Progress Notes (Signed)
    Jill Price [birth year] [id number]        51 y.o.  G1P1 Single  RP:  New patient presenting for Oligomenorrhea/Contraception  HPI:  Oligomenorrhea with periods q1-5 months.  LMP normal 10/01/2016.  Sexually active, no current contraception except condoms, with condom failure mid June 2018.  Had a UPT neg 6/282018.  No hot flashes or night sweats.  No vaginal dryness.  Pap normal 05/2016.  Mammo neg 07/2016.  Past medical history,surgical history, problem list, medications, allergies, family history and social history were all reviewed and documented in the EPIC chart.  Directed ROS with pertinent positives and negatives documented in the history of present illness/assessment and plan.  Exam:  Vitals:   10/10/16 1407  Weight: 62.1 kg (137 lb)  Height: 5\' 5"  (1.651 m)   General appearance:  Normal   Assessment/Plan:  51 y.o. G1P1   1. Secondary oligomenorrhea Start Progestin-only BCPs.  F/U Pelvic US for Endometrial line.  Risks associated with Oligo-Ovulation in Perimenopause discussed including the risk of Endometrial Hyperplasia and Endometrial Cancer.   - FSH - TSH - US Transvaginal Non-OB; Future  2. Encounter for initial prescription of contraceptive pills Given her age giving her higher CV risks and lower Fertility, decision to start on Progestin-Pill.  Counseling on above issues >50% x 30 minutes.  [name] MD, 2:12 PM 10/10/2016

## 2016-10-12 ENCOUNTER — Other Ambulatory Visit: Payer: BC Managed Care – PPO

## 2016-10-15 NOTE — Patient Instructions (Signed)
1. Secondary oligomenorrhea Start Progestin-only BCPs.  F/U Pelvic US for Endometrial line.  Risks associated with Oligo-Ovulation in Perimenopause discussed including the risk of Endometrial Hyperplasia and Endometrial Cancer.   - FSH - TSH - US Transvaginal Non-OB; Future  2. Encounter for initial prescription of contraceptive pills Given her age giving her higher CV risks and lower Fertility, decision to start on Progestin-Pill.  Jill Price, it was a pleasure to meet you today!  I will inform you of your results as soon as available.

## 2016-10-27 ENCOUNTER — Ambulatory Visit: Payer: BC Managed Care – PPO | Admitting: Obstetrics & Gynecology

## 2016-10-27 ENCOUNTER — Other Ambulatory Visit: Payer: BC Managed Care – PPO

## 2016-11-15 ENCOUNTER — Telehealth: Payer: Self-pay | Admitting: Family Medicine

## 2016-11-15 NOTE — Telephone Encounter (Signed)
Pt would like to have a call back would not elaborate on the reason.

## 2016-11-17 NOTE — Telephone Encounter (Signed)
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

## 2016-12-20 ENCOUNTER — Other Ambulatory Visit: Payer: BC Managed Care – PPO

## 2016-12-20 ENCOUNTER — Other Ambulatory Visit: Payer: Self-pay | Admitting: Anesthesiology

## 2016-12-20 DIAGNOSIS — N914 Secondary oligomenorrhea: Secondary | ICD-10-CM

## 2016-12-21 LAB — FOLLICLE STIMULATING HORMONE: FSH: 94.2 m[IU]/mL

## 2016-12-21 LAB — TSH: TSH: 1.38 mIU/L

## 2016-12-28 ENCOUNTER — Ambulatory Visit (INDEPENDENT_AMBULATORY_CARE_PROVIDER_SITE_OTHER): Payer: BC Managed Care – PPO | Admitting: Obstetrics & Gynecology

## 2016-12-28 ENCOUNTER — Ambulatory Visit (INDEPENDENT_AMBULATORY_CARE_PROVIDER_SITE_OTHER): Payer: BC Managed Care – PPO

## 2016-12-28 ENCOUNTER — Other Ambulatory Visit: Payer: Self-pay | Admitting: Obstetrics & Gynecology

## 2016-12-28 DIAGNOSIS — D251 Intramural leiomyoma of uterus: Secondary | ICD-10-CM

## 2016-12-28 DIAGNOSIS — N951 Menopausal and female climacteric states: Secondary | ICD-10-CM

## 2016-12-28 DIAGNOSIS — N914 Secondary oligomenorrhea: Secondary | ICD-10-CM

## 2016-12-28 DIAGNOSIS — N930 Postcoital and contact bleeding: Secondary | ICD-10-CM

## 2016-12-28 NOTE — Progress Notes (Signed)
    Jill Price [birth year] [id number]        50 y.o.  G1P1   RP:  Pelvic US for Perimenopausal bleeding  HPI:  Oligomenorrhea with light menses q1-5 months, LMP 09/2016.  Not complaining of hot flashes or night sweats.  Last visit's [place] on 12/20/2016 was 94.2.  Past medical history,surgical history, problem list, medications, allergies, family history and social history were all reviewed and documented in the EPIC chart.  Directed ROS with pertinent positives and negatives documented in the history of present illness/assessment and plan.  Exam:  There were no vitals filed for this visit. General appearance:  Normal   Pelvic US today: T/V Retroverted Uterus with Intramural Fibroids 1.9 x 1.5 cm, 1.4 x 0.9 cm, 0.7 x 1.2 cm, 1.8 x 1.2 cm and 0.8 cm.  Endometrial line thin at 2.3 mm. Right ovary normal with an echo-free follicle 1.9 x 1.2 cm.  Left ovary with a thin-walled follicle with low level echoes, neg CFD 1.3 x 1.3 cm.  No FF in CDS.  Assessment/Plan:  51 y.o. G1P1   1. Perimenopause Oligomenorrhea/PMB.  [place] 12/20/2016 at 94. 2.  Pelvic US today shows a thin reassuring Endometrial line c/w menopause.  Ovaries still showing small follicles.  Probalby just entering Menopause vs still in Perimenopause.  Strongly recommend contraception.  Patient opts for condoms, declines alternatives at this time.  Counseling on above issues >50% x 15 minutes.  [name] MD, 12:38 PM 12/28/2016

## 2016-12-29 NOTE — Patient Instructions (Signed)
1. Perimenopause Oligomenorrhea/PMB.  [place] 12/20/2016 at 94. 2.  Pelvic US today shows a thin reassuring Endometrial line c/w menopause.  Ovaries still showing small follicles.  Probalby just entering Menopause vs still in Perimenopause.  Strongly recommend contraception.  Patient opts for condoms, declines alternatives at this time.  Benjamine Mola, good to see you today!

## 2017-03-09 ENCOUNTER — Telehealth: Payer: Self-pay | Admitting: *Deleted

## 2017-03-09 NOTE — Telephone Encounter (Signed)
Patient had visit with you as a new patient on 10/10/16. Patient forgot to ask for Rx for valacyclovir Rx. Okay to send?

## 2017-03-10 MED ORDER — VALACYCLOVIR HCL 1 G PO TABS: ORAL_TABLET | ORAL | 3 refills | Status: DC

## 2017-03-10 NOTE — Telephone Encounter (Signed)
Pt aware, Rx sent. 

## 2017-03-10 NOTE — Telephone Encounter (Signed)
Yes, agree with Valacyclovir.

## 2017-05-24 LAB — BASIC METABOLIC PANEL
BUN: 15 (ref 4–21)
CREATININE: 0.7 (ref 0.5–1.1)
GLUCOSE: 114
POTASSIUM: 4.3 (ref 3.4–5.3)
Sodium: 140 (ref 137–147)

## 2017-05-24 LAB — HEPATIC FUNCTION PANEL
ALT: 42 — AB (ref 7–35)
AST: 29 (ref 13–35)
Alkaline Phosphatase: 94 (ref 25–125)

## 2017-06-26 ENCOUNTER — Ambulatory Visit: Payer: BLUE CROSS/BLUE SHIELD | Admitting: Women's Health

## 2017-06-26 ENCOUNTER — Encounter: Payer: Self-pay | Admitting: Women's Health

## 2017-06-26 VITALS — BP 110/78

## 2017-06-26 DIAGNOSIS — B9689 Other specified bacterial agents as the cause of diseases classified elsewhere: Secondary | ICD-10-CM | POA: Diagnosis not present

## 2017-06-26 DIAGNOSIS — M79641 Pain in right hand: Secondary | ICD-10-CM | POA: Diagnosis not present

## 2017-06-26 DIAGNOSIS — N898 Other specified noninflammatory disorders of vagina: Secondary | ICD-10-CM

## 2017-06-26 DIAGNOSIS — M79642 Pain in left hand: Secondary | ICD-10-CM

## 2017-06-26 DIAGNOSIS — N760 Acute vaginitis: Secondary | ICD-10-CM | POA: Diagnosis not present

## 2017-06-26 LAB — WET PREP FOR TRICH, YEAST, CLUE

## 2017-06-26 MED ORDER — METRONIDAZOLE 500 MG PO TABS: 500.0000 mg | ORAL_TABLET | Freq: Two times a day (BID) | ORAL | 0 refills | Status: DC

## 2017-06-26 NOTE — Patient Instructions (Signed)
Bacterial Vaginosis Bacterial vaginosis is a vaginal infection that occurs when the normal balance of bacteria in the vagina is disrupted. It results from an overgrowth of certain bacteria. This is the most common vaginal infection among women ages 15-44. Because bacterial vaginosis increases your risk for STIs (sexually transmitted infections), getting treated can help reduce your risk for chlamydia, gonorrhea, herpes, and HIV (human immunodeficiency virus). Treatment is also important for preventing complications in pregnant women, because this condition can cause an early (premature) delivery. What are the causes? This condition is caused by an increase in harmful bacteria that are normally present in small amounts in the vagina. However, the reason that the condition develops is not fully understood. What increases the risk? The following factors may make you more likely to develop this condition:  Having a new sexual partner or multiple sexual partners.  Having unprotected sex.  Douching.  Having an intrauterine device (IUD).  Smoking.  Drug and alcohol abuse.  Taking certain antibiotic medicines.  Being pregnant.  You cannot get bacterial vaginosis from toilet seats, bedding, swimming pools, or contact with objects around you. What are the signs or symptoms? Symptoms of this condition include:  Grey or white vaginal discharge. The discharge can also be watery or foamy.  A fish-like odor with discharge, especially after sexual intercourse or during menstruation.  Itching in and around the vagina.  Burning or pain with urination.  Some women with bacterial vaginosis have no signs or symptoms. How is this diagnosed? This condition is diagnosed based on:  Your medical history.  A physical exam of the vagina.  Testing a sample of vaginal fluid under a microscope to look for a large amount of bad bacteria or abnormal cells. Your health care provider may use a cotton swab  or a small wooden spatula to collect the sample.  How is this treated? This condition is treated with antibiotics. These may be given as a pill, a vaginal cream, or a medicine that is put into the vagina (suppository). If the condition comes back after treatment, a second round of antibiotics may be needed. Follow these instructions at home: Medicines  Take over-the-counter and prescription medicines only as told by your health care provider.  Take or use your antibiotic as told by your health care provider. Do not stop taking or using the antibiotic even if you start to feel better. General instructions  If you have a female sexual partner, tell her that you have a vaginal infection. She should see her health care provider and be treated if she has symptoms. If you have a female sexual partner, he does not need treatment.  During treatment: ? Avoid sexual activity until you finish treatment. ? Do not douche. ? Avoid alcohol as directed by your health care provider. ? Avoid breastfeeding as directed by your health care provider.  Drink enough water and fluids to keep your urine clear or pale yellow.  Keep the area around your vagina and rectum clean. ? Wash the area daily with warm water. ? Wipe yourself from front to back after using the toilet.  Keep all follow-up visits as told by your health care provider. This is important. How is this prevented?  Do not douche.  Wash the outside of your vagina with warm water only.  Use protection when having sex. This includes latex condoms and dental dams.  Limit how many sexual partners you have. To help prevent bacterial vaginosis, it is best to have sex with just   one partner (monogamous).  Make sure you and your sexual partner are tested for STIs.  Wear cotton or cotton-lined underwear.  Avoid wearing tight pants and pantyhose, especially during summer.  Limit the amount of alcohol that you drink.  Do not use any products that  contain nicotine or tobacco, such as cigarettes and e-cigarettes. If you need help quitting, ask your health care provider.  Do not use illegal drugs. Where to find more information:  Centers for Disease Control and Prevention: www.cdc.gov/std  American Sexual Health Association (ASHA): www.ashastd.org  U.S. Department of Health and Human Services, Office on Women's Health: www.womenshealth.gov/ or https://www.womenshealth.gov/a-z-topics/bacterial-vaginosis Contact a health care provider if:  Your symptoms do not improve, even after treatment.  You have more discharge or pain when urinating.  You have a fever.  You have pain in your abdomen.  You have pain during sex.  You have vaginal bleeding between periods. Summary  Bacterial vaginosis is a vaginal infection that occurs when the normal balance of bacteria in the vagina is disrupted.  Because bacterial vaginosis increases your risk for STIs (sexually transmitted infections), getting treated can help reduce your risk for chlamydia, gonorrhea, herpes, and HIV (human immunodeficiency virus). Treatment is also important for preventing complications in pregnant women, because the condition can cause an early (premature) delivery.  This condition is treated with antibiotic medicines. These may be given as a pill, a vaginal cream, or a medicine that is put into the vagina (suppository). This information is not intended to replace advice given to you by your health care provider. Make sure you discuss any questions you have with your health care provider. Document Released: 03/21/2005 Document Revised: 07/25/2016 Document Reviewed: 12/05/2015 Elsevier Interactive Patient Education  2018 Elsevier Inc.  

## 2017-06-26 NOTE — Progress Notes (Signed)
52 year old S WF G1P1 presents with several complaints.  States is having vaginal discomfort, pain in the clitoral area as well as the vagina for 3 weeks.  Took Valtrex  with no relief.  Denies vaginal itching, abdominal pain, fever, urinary symptoms or visible discharge.  Not sexually active.  Denies need for STD screen.  Also states bilateral hand/finger pain below knuckles for the past few weeks.  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 discomfort/achy sensation during the night and in the a.m. resolves with moving during the day.  Had taken an anti-inflammatory medication for 10 days due to knee pain from orthopedist and had no relief of hand pain.  Denies family history of arthritis or RA.  Postmenopausal on no HRT with no bleeding.  Reports good health.  States had a questionable bug bite on right lower arm prior to hand pain.  Exam: Appears well.  Hands bilaterally good range of motion, equal grip, color good, less than 3-second capillary refill, strong radial pulses bilaterally. External genitalia erythematous, speculum exam milky adherent discharge noted with mild odor, wet prep positive for clues, TNTC bacteria.  Bimanual nontender, no adnexal tenderness  Bacterial vaginosis Bilateral hand pain  Plan: Flagyl 500 twice daily for 7 days, alcohol precautions reviewed.  Instructed to call if no relief of rotation.  Will avoid hand weights for several weeks, if symptoms persist will follow up with neurologist.  Reports normal labs at primary care will check a sed rate today.

## 2017-06-27 ENCOUNTER — Encounter: Payer: Self-pay | Admitting: Women's Health

## 2017-06-27 LAB — SEDIMENTATION RATE: SED RATE: 6 mm/h (ref 0–30)

## 2017-07-07 ENCOUNTER — Ambulatory Visit: Payer: BLUE CROSS/BLUE SHIELD | Admitting: Obstetrics & Gynecology

## 2017-07-07 ENCOUNTER — Encounter: Payer: Self-pay | Admitting: Obstetrics & Gynecology

## 2017-07-07 VITALS — BP 126/84

## 2017-07-07 DIAGNOSIS — N898 Other specified noninflammatory disorders of vagina: Secondary | ICD-10-CM | POA: Diagnosis not present

## 2017-07-07 DIAGNOSIS — B373 Candidiasis of vulva and vagina: Secondary | ICD-10-CM

## 2017-07-07 DIAGNOSIS — B3731 Acute candidiasis of vulva and vagina: Secondary | ICD-10-CM

## 2017-07-07 LAB — WET PREP FOR TRICH, YEAST, CLUE

## 2017-07-07 MED ORDER — FLUCONAZOLE 150 MG PO TABS: 150.0000 mg | ORAL_TABLET | Freq: Every day | ORAL | 1 refills | Status: AC

## 2017-07-07 NOTE — Patient Instructions (Signed)
1. Vaginal discharge Wet prep negative, but clinical yeast vaginitis - WET PREP FOR TRICH, YEAST, CLUE  2. Yeast vaginitis Yeast vaginitis post Antibiotherapy.  Treat with Fluconazole 150 mg 1 tab per mouth daily x 3.  Probiotic to prevent recurrence after treatment.  F/U Annual/Gyn exam.  Other orders - fluconazole (DIFLUCAN) 150 MG tablet; Take 1 tablet (150 mg total) by mouth daily for 3 days.  Aggie Moats, good seeing you today!   Vaginal Yeast infection, Adult Vaginal yeast infection is a condition that causes soreness, swelling, and redness (inflammation) of the vagina. It also causes vaginal discharge. This is a common condition. Some women get this infection frequently. What are the causes? This condition is caused by a change in the normal balance of the yeast (candida) and bacteria that live in the vagina. This change causes an overgrowth of yeast, which causes the inflammation. What increases the risk? This condition is more likely to develop in:  Women who take antibiotic medicines.  Women who have diabetes.  Women who take birth control pills.  Women who are pregnant.  Women who douche often.  Women who have a weak defense (immune) system.  Women who have been taking steroid medicines for a long time.  Women who frequently wear tight clothing.  What are the signs or symptoms? Symptoms of this condition include:  White, thick vaginal discharge.  Swelling, itching, redness, and irritation of the vagina. The lips of the vagina (vulva) may be affected as well.  Pain or a burning feeling while urinating.  Pain during sex.  How is this diagnosed? This condition is diagnosed with a medical history and physical exam. This will include a pelvic exam. Your health care provider will examine a sample of your vaginal discharge under a microscope. Your health care provider may send this sample for testing to confirm the diagnosis. How is this treated? This  condition is treated with medicine. Medicines may be over-the-counter or prescription. You may be told to use one or more of the following:  Medicine that is taken orally.  Medicine that is applied as a cream.  Medicine that is inserted directly into the vagina (suppository).  Follow these instructions at home:  Take or apply over-the-counter and prescription medicines only as told by your health care provider.  Do not have sex until your health care provider has approved. Tell your sex partner that you have a yeast infection. That person should go to his or her health care provider if he or she develops symptoms.  Do not wear tight clothes, such as pantyhose or tight pants.  Avoid using tampons until your health care provider approves.  Eat more yogurt. This may help to keep your yeast infection from returning.  Try taking a sitz bath to help with discomfort. This is a warm water bath that is taken while you are sitting down. The water should only come up to your hips and should cover your buttocks. Do this 3-4 times per day or as told by your health care provider.  Do not douche.  Wear breathable, cotton underwear.  If you have diabetes, keep your blood sugar levels under control. Contact a health care provider if:  You have a fever.  Your symptoms go away and then return.  Your symptoms do not get better with treatment.  Your symptoms get worse.  You have new symptoms.  You develop blisters in or around your vagina.  You have blood coming from your vagina and it  is not your menstrual period.  You develop pain in your abdomen. This information is not intended to replace advice given to you by your health care provider. Make sure you discuss any questions you have with your health care provider. Document Released: 12/29/2004 Document Revised: 09/02/2015 Document Reviewed: 09/22/2014 Elsevier Interactive Patient Education  2018 [employer].

## 2017-07-07 NOTE — Progress Notes (Signed)
    Jill Price 12/08/1965 [id number]        52 y.o.  G1P1   RP: Vaginal discharge with itching post antibiotic treatment  HPI: Had BV treated with Flagyl times 7 days starting June 26, 2017.  The BV symptoms including odor resolved, but she developed a sick discharge with itching vaginally.  No pelvic pain.  Declines STD screening.  Menopause with tolerable hot flashes, some weight gain and mood swings.  No symptoms of depression.   OB History  Gravida Para Term Preterm AB Living  1 1       1   SAB TAB Ectopic Multiple Live Births               # Outcome Date GA Lbr Len/2nd Weight Sex Delivery Anes PTL Lv  1 Para             Past medical history,surgical history, problem list, medications, allergies, family history and social history were all reviewed and documented in the EPIC chart.   Directed ROS with pertinent positives and negatives documented in the history of present illness/assessment and plan.  Exam:  Vitals:   07/07/17 1435  BP: 126/84   General appearance:  Normal   Gynecologic exam: Vulva normal.  Speculum: Cervix and vagina normal.  Increased thick yeastlike vaginal discharge.  Wet prep done.  Wet prep: Negative  Assessment/Plan:  53 y.o. G1P1   1. Vaginal discharge Wet prep negative, but clinical yeast vaginitis - WET PREP FOR TRICH, YEAST, CLUE  2. Yeast vaginitis Yeast vaginitis post Antibiotherapy.  Treat with Fluconazole 150 mg 1 tab per mouth daily x 3.  Probiotic to prevent recurrence after treatment.  F/U Annual/Gyn exam.  Other orders - fluconazole (DIFLUCAN) 150 MG tablet; Take 1 tablet (150 mg total) by mouth daily for 3 days.  Counseling on above issues more than 50% for 15 minutes.  [name] MD, 2:40 PM 07/07/2017

## 2017-07-13 ENCOUNTER — Encounter: Payer: Self-pay | Admitting: Family Medicine

## 2017-07-13 ENCOUNTER — Ambulatory Visit: Payer: BLUE CROSS/BLUE SHIELD | Admitting: Family Medicine

## 2017-07-13 VITALS — BP 106/62 | HR 72 | Temp 98.6°F | Ht 65.0 in | Wt 141.2 lb

## 2017-07-13 DIAGNOSIS — R5383 Other fatigue: Secondary | ICD-10-CM | POA: Diagnosis not present

## 2017-07-13 DIAGNOSIS — N951 Menopausal and female climacteric states: Secondary | ICD-10-CM | POA: Insufficient documentation

## 2017-07-13 DIAGNOSIS — M79641 Pain in right hand: Secondary | ICD-10-CM | POA: Diagnosis not present

## 2017-07-13 DIAGNOSIS — M79642 Pain in left hand: Secondary | ICD-10-CM | POA: Diagnosis not present

## 2017-07-13 LAB — TSH: TSH: 1.54 u[IU]/mL (ref 0.35–4.50)

## 2017-07-13 LAB — VITAMIN D 25 HYDROXY (VIT D DEFICIENCY, FRACTURES): VITD: 19.04 ng/mL — AB (ref 30.00–100.00)

## 2017-07-13 LAB — CBC
HCT: 41.9 % (ref 36.0–46.0)
Hemoglobin: 13.9 g/dL (ref 12.0–15.0)
MCHC: 33.1 g/dL (ref 30.0–36.0)
MCV: 88.7 fl (ref 78.0–100.0)
PLATELETS: 273 10*3/uL (ref 150.0–400.0)
RBC: 4.72 Mil/uL (ref 3.87–5.11)
RDW: 13.6 % (ref 11.5–15.5)
WBC: 7.9 10*3/uL (ref 4.0–10.5)

## 2017-07-13 LAB — VITAMIN B12: VITAMIN B 12: 360 pg/mL (ref 211–911)

## 2017-07-13 LAB — FERRITIN: FERRITIN: 63.3 ng/mL (ref 10.0–291.0)

## 2017-07-13 LAB — MAGNESIUM: MAGNESIUM: 2.2 mg/dL (ref 1.5–2.5)

## 2017-07-13 MED ORDER — VITAMIN D (ERGOCALCIFEROL) 1.25 MG (50000 UNIT) PO CAPS: 50000.0000 [IU] | ORAL_CAPSULE | ORAL | 1 refills | Status: DC

## 2017-07-13 NOTE — Progress Notes (Signed)
Subjective:   Patient ID: Jill Price, female    DOB: [birth year], 52 y.o.   MRN: [id number]  Jill Price is a pleasant 52 y.o. year old female who presents to clinic today with New Patient (Initial Visit) (Patient is here today to establish care.  She declines the Tdap until records come in to be sure she has not already received them.  She had labs done at [facility]); Hand Problem (She is C/O issues with her hands that presented 2-3 weeks ago.  She woke up with her hands stiff that is worse in the right than the left but equally are painful and when you touch the joints they are tender.  It is worse in the mornings and has to work the stiffness out and it becomes tolerable through the day then starts over every morning.  Unsure if is related but 22-months-ago had a pustule on right forearm that just healed and unsure if it was a bite of some kind.); and Menopausal (She went to see someone at [facility] to express issues of menopause being hot-flashes, hair thinning, rosacea and was given Rx's for Lexapro and Rogaine and they drew labs.  She never filled these because she felt that she never was really listened to and would like to know the problems and likes the idea of holistic alternatives.)  on 07/13/2017  HPI:  Hand pain- has had bilateral hand pain and stiffness.  Menopause- was recently started on Lexapro and rogaine for this and labs were done.  She never started either rx.  Wanted to talk with me about this first. Hot flashes, some worsening insomnia, hair loss.  Has not had any noticeable vaginal dryness.  Has not had a period in over a year.    Current Outpatient Medications on File Prior to Visit  Medication Sig Dispense Refill  . valACYclovir (VALTREX) 1000 MG tablet Take one table by mouth daily 3-5 days for outbreak, then daily as needed. 90 tablet 3   No current facility-administered medications on file prior to visit.     No Known Allergies  Past Medical  History:  Diagnosis Date  . Depression   . Frequent headaches   . Migraines     No past surgical history on file.  Family History  Problem Relation Age of Onset  . Heart disease Father   . Dementia Father   . Breast cancer Neg Hx     Social History   Socioeconomic History  . Marital status: Single    Spouse name: Not on file  . Number of children: Not on file  . Years of education: Not on file  . Highest education level: Not on file  Occupational History  . Not on file  Social Needs  . Financial resource strain: Not on file  . Food insecurity:    Worry: Not on file    Inability: Not on file  . Transportation needs:    Medical: Not on file    Non-medical: Not on file  Tobacco Use  . Smoking status: Never Smoker  . Smokeless tobacco: Never Used  Substance and Sexual Activity  . Alcohol use: No  . Drug use: No  . Sexual activity: Not Currently    Birth control/protection: None  Lifestyle  . Physical activity:    Days per week: Not on file    Minutes per session: Not on file  . Stress: Not on file  Relationships  . Social connections:    Talks  on phone: Not on file    Gets together: Not on file    Attends religious service: Not on file    Active member of club or organization: Not on file    Attends meetings of clubs or organizations: Not on file    Relationship status: Not on file  . Intimate partner violence:    Fear of current or ex partner: Not on file    Emotionally abused: Not on file    Physically abused: Not on file    Forced sexual activity: Not on file  Other Topics Concern  . 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   Review of Systems  Constitutional: Positive for diaphoresis and fatigue.  HENT: Negative.   Eyes: Negative.   Respiratory: Negative.     Cardiovascular: Negative.   Gastrointestinal: Negative.   Endocrine: Negative.   Genitourinary: Negative.   Musculoskeletal: Positive for arthralgias.  Skin: Positive for rash.  Neurological: Negative.   Psychiatric/Behavioral: Positive for dysphoric mood and sleep disturbance. Negative for agitation, behavioral problems, confusion, decreased concentration and suicidal ideas. The patient is nervous/anxious.   All other systems reviewed and are negative.      Objective:    BP 106/62 (BP Location: Left Arm, Patient Position: Sitting, Cuff Size: Normal)   Pulse 72   Temp 98.6 F (37 C) (Oral)   Ht 5\' 5"  (1.651 m)   Wt 141 lb 3.2 oz (64 kg)   LMP 04/14/2016   SpO2 97%   BMI 23.50 kg/m    Physical Exam  Constitutional: She is oriented to person, place, and time. She appears well-developed and well-nourished. No distress.  HENT:  Head: Normocephalic and atraumatic.  Cardiovascular: Normal rate.  Pulmonary/Chest: Effort normal.  Musculoskeletal: Normal range of motion.  Neurological: She is alert and oriented to person, place, and time. She displays normal reflexes. No cranial nerve deficit or sensory deficit. She exhibits normal muscle tone. Coordination normal.  Skin: Skin is warm and dry. She is not diaphoretic.  Psychiatric: She has a normal mood and affect. Her behavior is normal.  Nursing note and vitals reviewed.         Assessment & Plan:   No diagnosis found. No follow-ups on file.

## 2017-07-13 NOTE — Progress Notes (Signed)
[facility]/thx dmf 

## 2017-07-13 NOTE — Patient Instructions (Addendum)
[facility]- [address]  [email or web address]. [phone]  I will call you with your lab results and you can see them online.  Try flonase OTC.

## 2017-07-13 NOTE — Assessment & Plan Note (Addendum)
>  30 minutes spent in face to face time with patient, >50% spent in counselling or coordination of care and discussing her symptoms of hot flashes, insomnia, joint pain, fatigue.  She understandably feels overwhelmed having just moved to [place] alone and looking for a new job.   She seems to have great insight into her emotions and self care- exercises, eats healthy/non processed foods.  I support her decision to not start lexapro or rogaine at this time.  We agreed to rule out vit D and Vit B12 deficiency, along with checking TSH, magnesium.  Labs reviewed from previous PCP- SED rate normal.  Cortisol and CMET normal as well. The patient indicates understanding of these issues and agrees with the plan.

## 2017-08-03 ENCOUNTER — Encounter

## 2017-08-03 ENCOUNTER — Ambulatory Visit: Payer: BLUE CROSS/BLUE SHIELD | Admitting: Family Medicine

## 2017-08-03 VITALS — BP 112/80 | HR 63 | Temp 98.6°F | Ht 65.0 in | Wt 142.0 lb

## 2017-08-03 DIAGNOSIS — E559 Vitamin D deficiency, unspecified: Secondary | ICD-10-CM | POA: Insufficient documentation

## 2017-08-03 DIAGNOSIS — N951 Menopausal and female climacteric states: Secondary | ICD-10-CM | POA: Diagnosis not present

## 2017-08-03 DIAGNOSIS — M79642 Pain in left hand: Secondary | ICD-10-CM

## 2017-08-03 DIAGNOSIS — M79641 Pain in right hand: Secondary | ICD-10-CM | POA: Diagnosis not present

## 2017-08-03 LAB — SEDIMENTATION RATE: Sed Rate: 11 mm/hr (ref 0–30)

## 2017-08-03 LAB — VITAMIN D 25 HYDROXY (VIT D DEFICIENCY, FRACTURES): VITD: 31.72 ng/mL (ref 30.00–100.00)

## 2017-08-03 LAB — C-REACTIVE PROTEIN: CRP: 0.1 mg/dL — AB (ref 0.5–20.0)

## 2017-08-03 NOTE — Patient Instructions (Signed)
Great to see you.  I will call you or email you with your lab results.

## 2017-08-03 NOTE — Progress Notes (Signed)
Subjective:   Patient ID: Jill Price, female    DOB: 04-08-1965, 52 y.o.   MRN: [id number]  Joanann Mies is a pleasant 52 y.o. year old female who presents to clinic today with Follow-up (Patient is here today to follow-up.  She is still having problems with joint achiness.  She takes the Vit-D 50k, MTV, Bone Strength, Omega pill.  She feels like her arms are heavy.  She wants to reevaluate to be certain.)  on 4/0/9811  HPI:  Established care with me on 07/13/17.  Note reviewed.  At that time, she was complaining of bilateral hand swelling, pain and stiffness, worse in the morning, right > left x 2-3 weeks.  She was also having hot flashes, skin changes, hair thinning and was told by GYN that she was menopausal and prescribed lexapro and rogaine, both of which she did not start.  She wanted to see me first to find out if something else was going on before starting medications. GYN checked SED rate, cortisol, CMET which were all normal.  I checked Vit B12, TSH, mag- all normal. Vit D was low- sent in high dose weekly vitamin D rx and she is here to follow up today.  Some symptoms have improved- hot flashes and hair thinning are much better. She is a little less fatigued. Never did start lexapro or rogaine and she is understandably not planning on taking either since those symptoms are better.  Unfortunately hand stiffness/heaviness persists.  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   Current Outpatient Medications on File Prior to Visit  Medication Sig Dispense Refill  . valACYclovir (VALTREX) 1000 MG tablet Take one table by mouth daily 3-5 days for outbreak, then daily as needed. 90 tablet 3  . Vitamin D, Ergocalciferol, (DRISDOL) 50000 units CAPS capsule Take 1 capsule (50,000 Units total) by mouth every 7 (seven) days. 12 capsule 1    No current facility-administered medications on file prior to visit.     No Known Allergies  Past Medical History:  Diagnosis Date  . Depression   . Frequent headaches   . Migraines     No past surgical history on file.  Family History  Problem Relation Age of Onset  . Heart disease Father   . Dementia Father   . Breast cancer Neg Hx     Social History   Socioeconomic History  . Marital status: Single    Spouse name: Not on file  . Number of children: Not on file  . Years of education: Not on file  . Highest education level: Not on file  Occupational History  . Not on file  Social Needs  . Financial resource strain: Not on file  . Food insecurity:    Worry: Not on file    Inability: Not on file  . Transportation needs:    Medical: Not on file    Non-medical: Not on file  Tobacco Use  . Smoking status: Never Smoker  . Smokeless tobacco: Never Used  Substance and Sexual Activity  . Alcohol use: No  . Drug use: No  . Sexual activity: Not Currently    Birth control/protection: None  Lifestyle  . Physical activity:    Days per week: Not on file    Minutes per session: Not on file  . Stress: Not on file  Relationships  . Social connections:    Talks on phone: Not on file    Gets together: Not on file    Attends religious service: Not on file    Active member of club or organization: Not on file    Attends meetings of clubs or organizations: Not on file    Relationship status: Not on file  . Intimate partner violence:    Fear of current or ex partner: Not on file    Emotionally abused: Not on file    Physically abused: Not on file    Forced sexual activity: Not on file  Other Topics Concern  . 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   Review of Systems   Constitutional: Positive for fatigue.  Respiratory: Negative.   Cardiovascular: Negative.   Endocrine: Negative.   Musculoskeletal: Positive for arthralgias, joint swelling and neck stiffness. Negative for back pain.  Skin: Positive for rash.  Neurological: Negative.   Psychiatric/Behavioral: Negative.   All other systems reviewed and are negative.      Objective:    BP 112/80 (BP Location: Left Arm, Patient Position: Sitting, Cuff Size: Normal)   Pulse 63   Temp 98.6 F (37 C) (Oral)   Ht 5\' 5"  (1.651 m)   Wt 142 lb (64.4 kg)   SpO2 98%   BMI 23.63 kg/m    Physical Exam  Constitutional: Vital signs are normal. She appears well-developed and well-nourished. No distress.  HENT:  Head: Normocephalic and atraumatic.  Eyes: EOM are normal.  Cardiovascular: Normal rate.  Pulmonary/Chest: Effort normal.  Musculoskeletal: Normal range of motion.  DIPS right hand swollen, not warm, tender  Skin: She is not diaphoretic.     Psychiatric: She has a normal mood and affect. Her behavior is normal. Judgment and thought content normal.  Nursing note and vitals reviewed.         Assessment & Plan:   Bilateral hand pain - Plan: Sedimentation rate, C-reactive protein, Cyclic citrul peptide antibody, IgG, Rheumatoid factor, Antinuclear Antib (ANA)  Menopausal symptoms  Vitamin D deficiency - Plan: Vitamin D (25 hydroxy) No follow-ups on file.

## 2017-08-04 ENCOUNTER — Other Ambulatory Visit: Payer: Self-pay

## 2017-08-04 ENCOUNTER — Telehealth: Payer: Self-pay

## 2017-08-04 ENCOUNTER — Other Ambulatory Visit: Payer: Self-pay | Admitting: Family Medicine

## 2017-08-04 DIAGNOSIS — M79642 Pain in left hand: Principal | ICD-10-CM

## 2017-08-04 DIAGNOSIS — R5383 Other fatigue: Secondary | ICD-10-CM

## 2017-08-04 DIAGNOSIS — M79641 Pain in right hand: Secondary | ICD-10-CM

## 2017-08-04 DIAGNOSIS — T148XXA Other injury of unspecified body region, initial encounter: Secondary | ICD-10-CM

## 2017-08-04 NOTE — Assessment & Plan Note (Signed)
Etiology remains unclear- while repleting her Vit D has helped with some of her symptoms, it has not helped with her hand pain, swelling and stiffness.  Does not seem consistent with cervical nerve/spinal etiology. Given her rashes and other symptoms, I would like to also rule out psoriatic arthritis today. Labs today, if all normal, will consult rheumatology. The patient indicates understanding of these issues and agrees with the plan. Orders Placed This Encounter  Procedures  . Sedimentation rate  . C-reactive protein  . Cyclic citrul peptide antibody, IgG  . Rheumatoid factor  . Antinuclear Antib (ANA)  . Vitamin D (25 hydroxy)

## 2017-08-04 NOTE — Assessment & Plan Note (Signed)
Still taking high dose weekly Vit D- recheck Vit D level today- I am expecting it has already increased since some of those symptoms have resolved. The patient indicates understanding of these issues and agrees with the plan.

## 2017-08-04 NOTE — Addendum Note (Signed)
Addended by: Diona Foley on: 08/04/2017 03:28 PM   Modules accepted: Orders

## 2017-08-04 NOTE — Telephone Encounter (Signed)
[facility] customer service was contacted for add-on testing. The following test were added. LAB 3190 (Rocky MTN Spotted FVR ABS PNL [IgG + IgM]) and LAB 3852 (Lyme Disease Ab, with Reflex to blot [IgG, IgM])  Rep sent add-on test to the lab.   Add-on testing for [facility] was sent via fax. - CBC testing. Hard copy in Lab specimen binder.    -DMG

## 2017-08-05 NOTE — Addendum Note (Signed)
Addended by: Lucille Passy on: 08/05/2017 09:32 PM   Modules accepted: Orders

## 2017-08-07 ENCOUNTER — Other Ambulatory Visit (INDEPENDENT_AMBULATORY_CARE_PROVIDER_SITE_OTHER): Payer: BLUE CROSS/BLUE SHIELD

## 2017-08-07 DIAGNOSIS — M79642 Pain in left hand: Secondary | ICD-10-CM

## 2017-08-07 DIAGNOSIS — R5383 Other fatigue: Secondary | ICD-10-CM

## 2017-08-07 DIAGNOSIS — M79641 Pain in right hand: Secondary | ICD-10-CM

## 2017-08-07 DIAGNOSIS — T148XXA Other injury of unspecified body region, initial encounter: Secondary | ICD-10-CM

## 2017-08-07 LAB — COMPREHENSIVE METABOLIC PANEL
ALK PHOS: 83 U/L (ref 39–117)
ALT: 32 U/L (ref 0–35)
AST: 25 U/L (ref 0–37)
Albumin: 4.2 g/dL (ref 3.5–5.2)
BILIRUBIN TOTAL: 0.7 mg/dL (ref 0.2–1.2)
BUN: 14 mg/dL (ref 6–23)
CALCIUM: 9.4 mg/dL (ref 8.4–10.5)
CO2: 28 mEq/L (ref 19–32)
Chloride: 103 mEq/L (ref 96–112)
Creatinine, Ser: 0.73 mg/dL (ref 0.40–1.20)
GFR: 88.46 mL/min (ref >60.00)
Glucose, Bld: 97 mg/dL (ref 70–99)
Potassium: 4.3 mEq/L (ref 3.5–5.1)
Sodium: 140 mEq/L (ref 135–145)
TOTAL PROTEIN: 6.9 g/dL (ref 6.0–8.3)

## 2017-08-07 LAB — CBC WITH DIFFERENTIAL/PLATELET
BASOS ABS: 0 10*3/uL (ref 0.0–0.1)
Basophils Relative: 0.5 % (ref 0.0–3.0)
Eosinophils Absolute: 0.2 10*3/uL (ref 0.0–0.7)
Eosinophils Relative: 2.8 % (ref 0.0–5.0)
HEMATOCRIT: 40.4 % (ref 36.0–46.0)
HEMOGLOBIN: 13.5 g/dL (ref 12.0–15.0)
LYMPHS PCT: 27.3 % (ref 12.0–46.0)
Lymphs Abs: 2.3 10*3/uL (ref 0.7–4.0)
MCHC: 33.3 g/dL (ref 30.0–36.0)
MCV: 88 fl (ref 78.0–100.0)
Monocytes Absolute: 0.4 10*3/uL (ref 0.1–1.0)
Monocytes Relative: 4.8 % (ref 3.0–12.0)
Neutro Abs: 5.4 10*3/uL (ref 1.4–7.7)
Neutrophils Relative %: 64.6 % (ref 43.0–77.0)
Platelets: 252 10*3/uL (ref 150.0–400.0)
RBC: 4.59 Mil/uL (ref 3.87–5.11)
RDW: 13.1 % (ref 11.5–15.5)
WBC: 8.3 10*3/uL (ref 4.0–10.5)

## 2017-08-07 LAB — FERRITIN: Ferritin: 71.2 ng/mL (ref 10.0–291.0)

## 2017-08-08 LAB — CYCLIC CITRUL PEPTIDE ANTIBODY, IGG

## 2017-08-08 LAB — RHEUMATOID FACTOR: Rhuematoid fact SerPl-aCnc: <14 IU/mL (ref <14)

## 2017-08-08 LAB — TEST AUTHORIZATION

## 2017-08-08 LAB — PTH, INTACT AND CALCIUM
CALCIUM: 9.5 mg/dL (ref 8.6–10.4)
PTH: 44 pg/mL (ref 14–64)

## 2017-08-08 LAB — ANA: ANA: NEGATIVE

## 2017-08-08 LAB — RPR: RPR Ser Ql: NONREACTIVE

## 2017-08-08 LAB — ROCKY MTN SPOTTED FVR ABS PNL(IGG+IGM)
RMSF IGG: NOT DETECTED
RMSF IGM: NOT DETECTED

## 2017-08-08 LAB — B. BURGDORFI ANTIBODIES: B burgdorferi Ab IgG+IgM: <0.9 index

## 2017-08-24 ENCOUNTER — Encounter: Payer: Self-pay | Admitting: Family Medicine

## 2017-08-24 ENCOUNTER — Ambulatory Visit: Payer: BLUE CROSS/BLUE SHIELD | Admitting: Family Medicine

## 2017-08-24 VITALS — BP 118/68 | HR 61 | Temp 98.5°F | Ht 65.0 in | Wt 141.8 lb

## 2017-08-24 DIAGNOSIS — M79642 Pain in left hand: Secondary | ICD-10-CM | POA: Diagnosis not present

## 2017-08-24 DIAGNOSIS — E559 Vitamin D deficiency, unspecified: Secondary | ICD-10-CM | POA: Diagnosis not present

## 2017-08-24 DIAGNOSIS — N951 Menopausal and female climacteric states: Secondary | ICD-10-CM

## 2017-08-24 DIAGNOSIS — M79641 Pain in right hand: Secondary | ICD-10-CM | POA: Diagnosis not present

## 2017-08-24 NOTE — Assessment & Plan Note (Signed)
>  25 minutes spent in face to face time with patient, >50% spent in counselling or coordination of care discussing Vit D deficiency, arthralgias, and HRT. She would like to not change any rxs or add anything at this time.  Repeat vit D in 3 weeks and proceed from there. Answered her questions about HRT- discussed risks and benefits. .  Pt has a uterus and needs progesterone with estrogen to prevent endometrial hyperplasia (20-50% of patients on unopposed estrogen for 1 yr will develop hyperplasia). The patient indicates understanding of these issues and agrees with the plan.

## 2017-08-24 NOTE — Patient Instructions (Addendum)
Great to see you. Congratulations!!  Please schedule a lab visit on your way out.

## 2017-08-24 NOTE — Progress Notes (Signed)
Subjective:   Patient ID: Jill Price, female    DOB: 03-19-1966, 52 y.o.   MRN: [id number]  Jill Price is a pleasant 52 y.o. year old female who presents to clinic today with hormone replacement therapy (Would like to discuss next steps for HRT.) and Hand Problem (Patient states that they are not 100% but they are getting better.  Moving in the right direction though.)  on 08/24/2017  HPI:  She does feel her hand pain is much better since being in high dose vit D.  Currently taking 50,000 IU weekly.  Vit D increased from 19.04 to 31.72 on 08/03/17.  Less fatigued, less mood swings, sleeping better. Recently accepted a position in [place] and very excited about this new opportunity.  Wants to talk about HRT as [place] was elevated but does not want to start it yet as her symptoms are so much better.  Current Outpatient Medications on File Prior to Visit  Medication Sig Dispense Refill  . valACYclovir (VALTREX) 1000 MG tablet Take one table by mouth daily 3-5 days for outbreak, then daily as needed. 90 tablet 3  . Vitamin D, Ergocalciferol, (DRISDOL) 50000 units CAPS capsule Take 1 capsule (50,000 Units total) by mouth every 7 (seven) days. 12 capsule 1   No current facility-administered medications on file prior to visit.     No Known Allergies  Past Medical History:  Diagnosis Date  . Depression   . Frequent headaches   . Migraines     No past surgical history on file.  Family History  Problem Relation Age of Onset  . Heart disease Father   . Dementia Father   . Breast cancer Neg Hx     Social History   Socioeconomic History  . Marital status: Single    Spouse name: Not on file  . Number of children: Not on file  . Years of education: Not on file  . Highest education level: Not on file  Occupational History  . Not on file  Social Needs  . Financial resource strain: Not on file  . Food insecurity:    Worry: Not on file    Inability: Not on  file  . Transportation needs:    Medical: Not on file    Non-medical: Not on file  Tobacco Use  . Smoking status: Never Smoker  . Smokeless tobacco: Never Used  Substance and Sexual Activity  . Alcohol use: No  . Drug use: No  . Sexual activity: Not Currently    Birth control/protection: None  Lifestyle  . Physical activity:    Days per week: Not on file    Minutes per session: Not on file  . Stress: Not on file  Relationships  . Social connections:    Talks on phone: Not on file    Gets together: Not on file    Attends religious service: Not on file    Active member of club or organization: Not on file    Attends meetings of clubs or organizations: Not on file    Relationship status: Not on file  . Intimate partner violence:    Fear of current or ex partner: Not on file    Emotionally abused: Not on file    Physically abused: Not on file    Forced sexual activity: Not on file  Other Topics Concern  . 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   Review of Systems  Constitutional: Positive for fatigue.  HENT: Negative.   Eyes: Negative.   Respiratory: Negative.   Cardiovascular: Negative.   Endocrine: Negative.   Musculoskeletal: Positive for arthralgias.  Allergic/Immunologic: Negative.   Neurological: Negative.   Hematological: Negative.   Psychiatric/Behavioral: Negative.   All other systems reviewed and are negative.      Objective:    BP 118/68 (BP Location: Left Arm, Patient Position: Sitting, Cuff Size: Normal)   Pulse 61   Temp 98.5 F (36.9 C) (Oral)   Ht 5\' 5"  (1.651 m)   Wt 141 lb 12.8 oz (64.3 kg)   SpO2 100%   BMI 23.60 kg/m    Physical Exam  . General:  Well-developed,well-nourished,in no acute distress; alert,appropriate and cooperative throughout  examination Head:  normocephalic and atraumatic.   Eyes:  vision grossly intact, PERRL Ears:  R ear normal and L ear normal externally, TMs clear bilaterally Nose:  no external deformity.   Mouth:  good dentition.   Neck:  No deformities, masses, or tenderness noted.  Lungs:  Normal respiratory effort, chest expands symmetrically. Lungs are clear to auscultation, no crackles or wheezes. Heart:  Normal rate and regular rhythm. S1 and S2 normal without gallop, murmur, click, rub or other extra sounds. Msk:  No deformity or scoliosis noted of thoracic or lumbar spine.   Extremities:  No clubbing, cyanosis, edema, or deformity noted with normal full range of motion of all joints.   Neurologic:  alert & oriented X3 and gait normal.   Skin:  Intact without suspicious lesions or rashes Psych:  Cognition and judgment appear intact. Alert and cooperative with normal attention span and concentration. No apparent delusions, illusions, hallucinations      Assessment & Plan:   Menopausal symptoms  Vitamin D deficiency  Bilateral hand pain No follow-ups on file.

## 2017-08-29 ENCOUNTER — Telehealth: Payer: Self-pay | Admitting: Family Medicine

## 2017-08-29 MED ORDER — CONJ ESTROG-MEDROXYPROGEST ACE 0.3-1.5 MG PO TABS: 1.0000 | ORAL_TABLET | Freq: Every day | ORAL | 3 refills | Status: DC

## 2017-08-29 NOTE — Telephone Encounter (Signed)
Patient sent a text.  Sexual intercourse was painful and she wants to go ahead and start HRT.   We discussed risks and benefits at her last OV.  Pt has a uterus and needs progesterone with estrogen to prevent endometrial hyperplasia (20-50% of patients on unopposed estrogen for 1 yr will develop hyperplasia). eRx for [facility]pro [facility] on file.

## 2017-08-31 ENCOUNTER — Encounter: Payer: Self-pay | Admitting: Obstetrics & Gynecology

## 2017-08-31 ENCOUNTER — Ambulatory Visit: Payer: BLUE CROSS/BLUE SHIELD | Admitting: Obstetrics & Gynecology

## 2017-08-31 VITALS — BP 112/70

## 2017-08-31 DIAGNOSIS — N952 Postmenopausal atrophic vaginitis: Secondary | ICD-10-CM

## 2017-08-31 DIAGNOSIS — Z113 Encounter for screening for infections with a predominantly sexual mode of transmission: Secondary | ICD-10-CM | POA: Diagnosis not present

## 2017-08-31 DIAGNOSIS — Z13820 Encounter for screening for osteoporosis: Secondary | ICD-10-CM

## 2017-08-31 DIAGNOSIS — N9411 Superficial (introital) dyspareunia: Secondary | ICD-10-CM | POA: Diagnosis not present

## 2017-08-31 LAB — WET PREP FOR TRICH, YEAST, CLUE

## 2017-08-31 MED ORDER — ESTRADIOL 10 MCG VA TABS: 1.0000 | ORAL_TABLET | VAGINAL | 4 refills | Status: DC

## 2017-08-31 NOTE — Progress Notes (Signed)
    Verbie Babic Jan 24, 1966 [id number]        52 y.o.  G1P1 New boyfriend  RP: Pain with IC a week ago  HPI: Menopause on no HRT.  No PMB.  Had sexual activity last week-end after 6 months of abstinence.  No condom use.  Painful at penetration at introitus and in vagina.  No vaginal itching, no odor.  No pelvic pain.  Urine/BMs wnl.  New partner is an "old flame" and had recent negative STI work-up.  Patient is on Vit D 50000 IU weekly protocol.  No vasomotor Sx of menopause, good mood, fit and eating well.   OB History  Gravida Para Term Preterm AB Living  1 1       1   SAB TAB Ectopic Multiple Live Births               # Outcome Date GA Lbr Len/2nd Weight Sex Delivery Anes PTL Lv  1 Para             Past medical history,surgical history, problem list, medications, allergies, family history and social history were all reviewed and documented in the EPIC chart.   Directed ROS with pertinent positives and negatives documented in the history of present illness/assessment and plan.  Exam:  Vitals:   08/31/17 0902  BP: 112/70   General appearance:  Normal  Abdomen: Normal  Gynecologic exam: Vulva/introitus atrophy of menopause.  No discrete lesion.  Speculum: Cervix and vagina normal.  Normal vaginal secretions.  Wet prep done.  Gonorrhea and Chlamydia done on cervix.  Wet prep negative   Assessment/Plan:  52 y.o. G1P1   1. Superficial dyspareunia Probably due to postmenopausal atrophic vaginitis. - WET PREP FOR TRICH, YEAST, CLUE  2. Post-menopausal atrophic vaginitis Diagnosis and management discussed with patient.  Patient prefers not to be on systemic hormone replacement therapy, but would like to have local treatment to improve sexual activity.  Decision to start on the generic of Vagifem.  Will insert 1 tablet every night for 2 weeks and then will continue with maintenance twice a week.  Prescription sent to pharmacy.  3. Screen for STD (sexually transmitted  disease) Recommend condoms - WET PREP FOR [place], YEAST, CLUE - C. trachomatis/N. gonorrhoeae RNA  4. Screening for osteoporosis Schedule bone density here now.  Continue with vitamin D supplements, calcium rich nutrition and weightbearing physical activity. - DG Bone Density; Future  Other orders - Estradiol 10 MCG TABS vaginal tablet; Place 1 tablet (10 mcg total) vaginally 2 (two) times a week. Insert a tablet vaginally daily x 2 weeks, then twice a week.  Counseling on above issues and coordination of care more than 50% for 25 minutes.  [name] MD, 9:10 AM 08/31/2017

## 2017-08-31 NOTE — Patient Instructions (Signed)
1. Superficial dyspareunia Probably due to postmenopausal atrophic vaginitis. - WET PREP FOR TRICH, YEAST, CLUE  2. Post-menopausal atrophic vaginitis Diagnosis and management discussed with patient.  Patient prefers not to be on systemic hormone replacement therapy, but would like to have local treatment to improve sexual activity.  Decision to start on the generic of Vagifem.  Will insert 1 tablet every night for 2 weeks and then will continue with maintenance twice a week.  Prescription sent to pharmacy.  3. Screen for STD (sexually transmitted disease) Recommend condoms - WET PREP FOR [place], YEAST, CLUE - C. trachomatis/N. gonorrhoeae RNA  4. Screening for osteoporosis Schedule bone density here now.  Continue with vitamin D supplements, calcium rich nutrition and weightbearing physical activity. - DG Bone Density; Future  Other orders - Estradiol 10 MCG TABS vaginal tablet; Place 1 tablet (10 mcg total) vaginally 2 (two) times a week. Insert a tablet vaginally daily x 2 weeks, then twice a week.  Jill Price, it was a pleasure seeing you today!  I will inform you of your results as soon as they are available.

## 2017-09-01 LAB — C. TRACHOMATIS/N. GONORRHOEAE RNA
C. trachomatis RNA, TMA: NOT DETECTED
N. gonorrhoeae RNA, TMA: NOT DETECTED

## 2017-09-04 ENCOUNTER — Encounter

## 2017-09-04 ENCOUNTER — Ambulatory Visit: Payer: BLUE CROSS/BLUE SHIELD | Admitting: Psychology

## 2017-09-04 ENCOUNTER — Ambulatory Visit: Payer: No Typology Code available for payment source | Admitting: Psychology

## 2017-09-14 ENCOUNTER — Other Ambulatory Visit (INDEPENDENT_AMBULATORY_CARE_PROVIDER_SITE_OTHER): Payer: Self-pay

## 2017-09-14 DIAGNOSIS — E559 Vitamin D deficiency, unspecified: Secondary | ICD-10-CM

## 2017-09-14 LAB — VITAMIN D 25 HYDROXY (VIT D DEFICIENCY, FRACTURES): VITD: 49.1 ng/mL (ref 30.00–100.00)

## 2017-10-03 ENCOUNTER — Ambulatory Visit (INDEPENDENT_AMBULATORY_CARE_PROVIDER_SITE_OTHER): Payer: Managed Care, Other (non HMO) | Admitting: Obstetrics & Gynecology

## 2017-10-03 ENCOUNTER — Encounter: Payer: Self-pay | Admitting: Obstetrics & Gynecology

## 2017-10-03 VITALS — BP 112/78 | Ht 65.0 in | Wt 143.0 lb

## 2017-10-03 DIAGNOSIS — Z1151 Encounter for screening for human papillomavirus (HPV): Secondary | ICD-10-CM

## 2017-10-03 DIAGNOSIS — Z01419 Encounter for gynecological examination (general) (routine) without abnormal findings: Secondary | ICD-10-CM | POA: Diagnosis not present

## 2017-10-03 DIAGNOSIS — Z780 Asymptomatic menopausal state: Secondary | ICD-10-CM

## 2017-10-03 NOTE — Progress Notes (Signed)
Jill Price 1965-06-10 [id number]   History:    52 y.o. G1P1L1 Divorced.  Son lives in [place] with his father.  RP:  Established patient presenting for annual gyn exam   HPI: Menopause, well on Prempro x 2 weeks.  No PMB.  High FSH 12/2016 at 94.2.  Feeling much better after correcting her low Vit D level with 50,000 IU Vit D weekly.  Vit D 49.10 on 09/14/2017.  No pelvic pain.  Abstinent x last visit 08/31/2017 when Gono-Chlam came back negative.  Urine/BMs wnl.  Breasts wnl.  BMI 23.80.  Health labs with Fam MD.  Past medical history,surgical history, family history and social history were all reviewed and documented in the EPIC chart.  Gynecologic History No LMP recorded. Patient is perimenopausal. Contraception: post menopausal status Last Pap: 05/2016. Results were: Negative, but TZ cells absent Last mammogram: 07/2016. Results were: Negative Bone Density: Never Colonoscopy: Will schedule  Obstetric History OB History  Gravida Para Term Preterm AB Living  1 1       1   SAB TAB Ectopic Multiple Live Births               # Outcome Date GA Lbr Len/2nd Weight Sex Delivery Anes PTL Lv  1 Para              ROS: A ROS was performed and pertinent positives and negatives are included in the history.  GENERAL: No fevers or chills. HEENT: No change in vision, no earache, sore throat or sinus congestion. NECK: No pain or stiffness. CARDIOVASCULAR: No chest pain or pressure. No palpitations. PULMONARY: No shortness of breath, cough or wheeze. GASTROINTESTINAL: No abdominal pain, nausea, vomiting or diarrhea, melena or bright red blood per rectum. GENITOURINARY: No urinary frequency, urgency, hesitancy or dysuria. MUSCULOSKELETAL: No joint or muscle pain, no back pain, no recent trauma. DERMATOLOGIC: No rash, no itching, no lesions. ENDOCRINE: No polyuria, polydipsia, no heat or cold intolerance. No recent change in weight. HEMATOLOGICAL: No anemia or easy bruising or bleeding.  NEUROLOGIC: No headache, seizures, numbness, tingling or weakness. PSYCHIATRIC: No depression, no loss of interest in normal activity or change in sleep pattern.     Exam:   BP 112/78   Ht 5\' 5"  (1.651 m)   Wt 143 lb (64.9 kg)   BMI 23.80 kg/m   Body mass index is 23.8 kg/m.  General appearance : Well developed well nourished female. No acute distress HEENT: Eyes: no retinal hemorrhage or exudates,  Neck supple, trachea midline, no carotid bruits, no thyroidmegaly Lungs: Clear to auscultation, no rhonchi or wheezes, or rib retractions  Heart: Regular rate and rhythm, no murmurs or gallops Breast:Examined in sitting and supine position were symmetrical in appearance, no palpable masses or tenderness,  no skin retraction, no nipple inversion, no nipple discharge, no skin discoloration, no axillary or supraclavicular lymphadenopathy Abdomen: no palpable masses or tenderness, no rebound or guarding Extremities: no edema or skin discoloration or tenderness  Pelvic: Vulva: Normal             Vagina: No gross lesions or discharge  Cervix: No gross lesions or discharge.  Pap/HPV HR done.  Uterus  AV, normal size, shape and consistency, non-tender and mobile  Adnexa  Without masses or tenderness  Anus: Normal   Assessment/Plan:  52 y.o. female for annual exam   1. Encounter for routine gynecological examination with Papanicolaou smear of cervix Normal gynecologic exam and menopause.  Pap with high-risk HPV done  today.  Breast exam normal.  Will schedule screening mammogram now.  Health labs with family physician.  Organize screening colonoscopy through family physician.  Continue with fitness and healthy nutrition. - PAP,TP IMGw/HPV RNA,rflx HPVTYPE16,18/45  2. Menopause present Well on Prempro which was started 2 weeks ago.  No postmenopausal bleeding.  Patient reports a marked improvement in vasomotor menopausal symptoms after correcting her vitamin D levels.  Normal vitamin D at 49.10  in June 2019.  Recommend calcium rich nutrition and regular weightbearing physical activity.  3. Special screening examination for human papillomavirus (HPV) - PAP,TP IMGw/HPV RNA,rflx [id number],14/23  [name] MD, 10:46 AM 10/03/2017

## 2017-10-08 ENCOUNTER — Encounter: Payer: Self-pay | Admitting: Obstetrics & Gynecology

## 2017-10-08 NOTE — Patient Instructions (Signed)
1. Encounter for routine gynecological examination with Papanicolaou smear of cervix Normal gynecologic exam and menopause.  Pap with high-risk HPV done today.  Breast exam normal.  Will schedule screening mammogram now.  Health labs with family physician.  Organize screening colonoscopy through family physician.  Continue with fitness and healthy nutrition. - PAP,TP IMGw/HPV RNA,rflx HPVTYPE16,18/45  2. Menopause present Well on Prempro which was started 2 weeks ago.  No postmenopausal bleeding.  Patient reports a marked improvement in vasomotor menopausal symptoms after correcting her vitamin D levels.  Normal vitamin D at 49.10 in June 2019.  Recommend calcium rich nutrition and regular weightbearing physical activity.  3. Special screening examination for human papillomavirus (HPV) - PAP,TP IMGw/HPV RNA,rflx [id number],22/63  Jill Price, it was a pleasure seeing you today!  I will inform you of your results as soon as they are available.

## 2017-10-11 LAB — PAP, TP IMAGING W/ HPV RNA, RFLX HPV TYPE 16,18/45: HPV DNA High Risk: DETECTED — AB

## 2017-10-11 LAB — HPV TYPE 16 AND 18/45 RNA
HPV TYPE 16 RNA: NOT DETECTED
HPV Type 18/45 RNA: NOT DETECTED

## 2017-10-13 ENCOUNTER — Encounter: Payer: BLUE CROSS/BLUE SHIELD | Admitting: Obstetrics & Gynecology

## 2017-11-07 ENCOUNTER — Other Ambulatory Visit: Payer: Self-pay

## 2017-11-07 DIAGNOSIS — E559 Vitamin D deficiency, unspecified: Secondary | ICD-10-CM

## 2017-11-07 DIAGNOSIS — N914 Secondary oligomenorrhea: Secondary | ICD-10-CM

## 2017-11-07 DIAGNOSIS — R5383 Other fatigue: Secondary | ICD-10-CM

## 2017-11-07 DIAGNOSIS — N951 Menopausal and female climacteric states: Secondary | ICD-10-CM

## 2017-11-07 NOTE — Progress Notes (Signed)
Per TA pt will be coming in for lab draw this am/ordered Vit-Wynnie Pacetti Dx: E55.9/LH & FSH Dx: N91.4, R53.83, N95.1/thx dmf

## 2017-11-09 ENCOUNTER — Encounter: Payer: Self-pay | Admitting: Family Medicine

## 2017-11-09 ENCOUNTER — Ambulatory Visit (INDEPENDENT_AMBULATORY_CARE_PROVIDER_SITE_OTHER): Payer: Managed Care, Other (non HMO) | Admitting: Family Medicine

## 2017-11-09 ENCOUNTER — Telehealth: Payer: Self-pay

## 2017-11-09 ENCOUNTER — Ambulatory Visit (INDEPENDENT_AMBULATORY_CARE_PROVIDER_SITE_OTHER): Payer: Managed Care, Other (non HMO) | Admitting: Obstetrics & Gynecology

## 2017-11-09 ENCOUNTER — Encounter: Payer: Self-pay | Admitting: Obstetrics & Gynecology

## 2017-11-09 VITALS — BP 114/78

## 2017-11-09 VITALS — BP 116/78 | HR 66 | Temp 98.9°F | Ht 65.0 in | Wt 143.3 lb

## 2017-11-09 DIAGNOSIS — N914 Secondary oligomenorrhea: Secondary | ICD-10-CM

## 2017-11-09 DIAGNOSIS — R87612 Low grade squamous intraepithelial lesion on cytologic smear of cervix (LGSIL): Secondary | ICD-10-CM

## 2017-11-09 DIAGNOSIS — Z13820 Encounter for screening for osteoporosis: Secondary | ICD-10-CM

## 2017-11-09 DIAGNOSIS — M79641 Pain in right hand: Secondary | ICD-10-CM

## 2017-11-09 DIAGNOSIS — R87810 Cervical high risk human papillomavirus (HPV) DNA test positive: Secondary | ICD-10-CM

## 2017-11-09 DIAGNOSIS — R5383 Other fatigue: Secondary | ICD-10-CM | POA: Diagnosis not present

## 2017-11-09 DIAGNOSIS — N951 Menopausal and female climacteric states: Secondary | ICD-10-CM | POA: Diagnosis not present

## 2017-11-09 DIAGNOSIS — M79642 Pain in left hand: Secondary | ICD-10-CM

## 2017-11-09 DIAGNOSIS — E559 Vitamin D deficiency, unspecified: Secondary | ICD-10-CM | POA: Diagnosis not present

## 2017-11-09 DIAGNOSIS — N870 Mild cervical dysplasia: Secondary | ICD-10-CM | POA: Diagnosis not present

## 2017-11-09 LAB — LUTEINIZING HORMONE: LH: 46.05 m[IU]/mL

## 2017-11-09 LAB — FOLLICLE STIMULATING HORMONE: FSH: 57.9 m[IU]/mL

## 2017-11-09 LAB — VITAMIN D 25 HYDROXY (VIT D DEFICIENCY, FRACTURES): VITD: 56.37 ng/mL (ref 30.00–100.00)

## 2017-11-09 MED ORDER — CONJ ESTROG-MEDROXYPROGEST ACE 0.3-1.5 MG PO TABS: 1.0000 | ORAL_TABLET | Freq: Every day | ORAL | 3 refills | Status: DC

## 2017-11-09 MED ORDER — VITAMIN D (ERGOCALCIFEROL) 1.25 MG (50000 UNIT) PO CAPS: 50000.0000 [IU] | ORAL_CAPSULE | ORAL | 1 refills | Status: DC

## 2017-11-09 NOTE — Assessment & Plan Note (Signed)
Improved with prempro. No changes made. Check LH and FSH today.

## 2017-11-09 NOTE — Progress Notes (Signed)
Subjective:   Patient ID: Jill Price, female    DOB: April 19, 1965, 52 y.o.   MRN: [id number]  Jill Price is a pleasant 52 y.o. year old female who presents to clinic today with Follow-up (f/u on vit d , pt taking meds daily & hand pain has improved. Pt has never had colonoscopy would consider Cologuard. )  on 11/09/2017  HPI:  Here for follow up. New job in [place] is going okay.  She has been there for 8 weeks now.  Getting used to a new city and new co workers.  She does feel her hand pain is much better over the past several weeks. Still taking high dose weekly Vit D 50,000 IU. She had labs drawn in our lab just prior to this visit (still not yet resulted).   She is also less tired.  Has been pleased with prempro.  She is frustrated with her weight but has not had a chance to start exercising regularly yet with her new routine.  Wt Readings from Last 3 Encounters:  11/09/17 143 lb 4.8 oz (65 kg)  10/03/17 143 lb (64.9 kg)  08/24/17 141 lb 12.8 oz (64.3 kg)    Current Outpatient Medications on File Prior to Visit  Medication Sig Dispense Refill  . valACYclovir (VALTREX) 1000 MG tablet Take one table by mouth daily 3-5 days for outbreak, then daily as needed. 90 tablet 3  . Estradiol 10 MCG TABS vaginal tablet Place 1 tablet (10 mcg total) vaginally 2 (two) times a week. Insert a tablet vaginally daily x 2 weeks, then twice a week. (Patient not taking: Reported on 10/03/2017) 24 tablet 4   No current facility-administered medications on file prior to visit.     No Known Allergies  Past Medical History:  Diagnosis Date  . Depression   . Frequent headaches   . Migraines     No past surgical history on file.  Family History  Problem Relation Age of Onset  . Heart disease Father   . Dementia Father   . Breast cancer Neg Hx     Social History   Socioeconomic History  . Marital status: Single    Spouse name: Not on file  . Number of children: Not  on file  . Years of education: Not on file  . Highest education level: Not on file  Occupational History  . Not on file  Social Needs  . Financial resource strain: Not on file  . Food insecurity:    Worry: Not on file    Inability: Not on file  . Transportation needs:    Medical: Not on file    Non-medical: Not on file  Tobacco Use  . Smoking status: Never Smoker  . Smokeless tobacco: Never Used  Substance and Sexual Activity  . Alcohol use: No  . Drug use: No  . Sexual activity: Not Currently    Birth control/protection: None    Comment: 1st intercourse- 64, partners- 4,   Lifestyle  . Physical activity:    Days per week: Not on file    Minutes per session: Not on file  . Stress: Not on file  Relationships  . Social connections:    Talks on phone: Not on file    Gets together: Not on file    Attends religious service: Not on file    Active member of club or organization: Not on file    Attends meetings of clubs or organizations: Not on file  Relationship status: Not on file  . Intimate partner violence:    Fear of current or ex partner: Not on file    Emotionally abused: Not on file    Physically abused: Not on file    Forced sexual activity: Not on file  Other Topics Concern  . 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   Review of Systems  Constitutional: Negative.   Gastrointestinal: Negative.   Musculoskeletal: Negative.   Neurological: Negative.   Hematological: Negative.   Psychiatric/Behavioral: Negative.   All other systems reviewed and are negative.      Objective:    BP 116/78   Pulse 66   Temp 98.9 F (37.2 C) (Oral)   Ht 5\' 5"  (1.651 m)   Wt 143 lb 4.8 oz (65 kg)   SpO2 98%   BMI 23.85 kg/m    Physical Exam  Constitutional: She is oriented to person,  place, and time. She appears well-developed and well-nourished. No distress.  HENT:  Head: Normocephalic and atraumatic.  Eyes: EOM are normal.  Neck: Normal range of motion.  Cardiovascular: Normal rate.  Pulmonary/Chest: Effort normal.  Musculoskeletal: Normal range of motion. She exhibits no edema.  Neurological: She is alert and oriented to person, place, and time. No cranial nerve deficit. Coordination normal.  Skin: Skin is warm and dry. She is not diaphoretic.  Psychiatric: She has a normal mood and affect. Her behavior is normal. Judgment and thought content normal.  Nursing note and vitals reviewed.         Assessment & Plan:   Menopausal symptoms - Plan: FSH, LH  Other fatigue - Plan: FSH, LH  Secondary oligomenorrhea - Plan: FSH, LH  Vitamin D deficiency - Plan: VITAMIN D 25 Hydroxy (Vit-D Deficiency, Fractures) No follow-ups on file.

## 2017-11-09 NOTE — Assessment & Plan Note (Signed)
Continue current dose of vit D.  Vit D pending.

## 2017-11-09 NOTE — Telephone Encounter (Signed)
Cologuard ordered , waiting for results 

## 2017-11-09 NOTE — Progress Notes (Signed)
    Jill Price 12-02-1965 [id number]        52 y.o.  G1P1L1 Divorced  RP: LGSIL/HPV HR positive for Colposcopy  HPI: Last Pap test 10/03/2017 LGSIL with HR HPV positive.  HPV 16-18-45 negative.  Remote h/o Cryotherapy of cervix.   OB History  Gravida Para Term Preterm AB Living  1 1       1   SAB TAB Ectopic Multiple Live Births               # Outcome Date GA Lbr Len/2nd Weight Sex Delivery Anes PTL Lv  1 Para             Past medical history,surgical history, problem list, medications, allergies, family history and social history were all reviewed and documented in the EPIC chart.   Directed ROS with pertinent positives and negatives documented in the history of present illness/assessment and plan.  Exam:  Vitals:   11/09/17 1204  BP: 114/78   General appearance:  Normal  Colposcopy Procedure Note Rilda Bulls 11/09/2017  Indications: LGSIL/HPV HR positive, HPV 16-18-45 negative  Procedure Details  The risks and benefits of the procedure and Verbal informed consent obtained.  Speculum placed in vagina and excellent visualization of cervix achieved, cervix swabbed x 3 with acetic acid solution.  Findings:  Cervix colposcopy: Physical Exam  Genitourinary:      Vaginal colposcopy: Normal  Vulvar colposcopy: Grossly normal  Perirectal colposcopy: Grossly normal  The cervix was sprayed with [place] before performing the cervical biopsies.  Specimens: Cervical biopsy at 3 O'clock  Complications: None, hemostasis with Silver Nitrate. . Plan: Pending cervical Bx result.  Management per result.    Assessment/Plan:  52 y.o. G1P1   1. LGSIL on Pap smear of cervix Low-grade SIL with high risk HPV on last Pap test October 03, 2017.  Remote history of cervical dysplasia treated with cryotherapy.  Counseling on cervical dysplasia and high risk HPV today.  Colposcopy procedure reviewed.  Colposcopy findings discussed with patient.  Probably mild  dysplasia.  Pathology pending.  2. Cervical high risk HPV (human papillomavirus) test positive HPV 16-18-45 negative  3. Screening for osteoporosis Recommend total calcium intake from nutrition and supplements of 1.2 g/day, vitamin D supplements and regular weightbearing physical activity.  Follow-up bone density here. - DG Bone Density; Future  Counseling on above issues and coordination of care than 50% for 10 minutes.  [name] MD, 12:18 PM 11/09/2017

## 2017-11-09 NOTE — Patient Instructions (Signed)
Great to see you.  You look amazing! I will call you with your lab results from today and you can view them online.

## 2017-11-09 NOTE — Assessment & Plan Note (Signed)
Improved from where it was a month ago. Checked Vit D today- awaiting results. No changes made to rxs.

## 2017-11-10 ENCOUNTER — Encounter: Payer: Self-pay | Admitting: Obstetrics & Gynecology

## 2017-11-10 NOTE — Patient Instructions (Signed)
1. LGSIL on Pap smear of cervix Low-grade SIL with high risk HPV on last Pap test October 03, 2017.  Remote history of cervical dysplasia treated with cryotherapy.  Counseling on cervical dysplasia and high risk HPV today.  Colposcopy procedure reviewed.  Colposcopy findings discussed with patient.  Probably mild dysplasia.  Pathology pending.  2. Cervical high risk HPV (human papillomavirus) test positive HPV 16-18-45 negative  3. Screening for osteoporosis Recommend total calcium intake from nutrition and supplements of 1.2 g/day, vitamin D supplements and regular weightbearing physical activity.  Follow-up bone density here. - DG Bone Density; Future  Jill Price, it was a pleasure seeing you today!  I will inform you of your results as soon as they are available.

## 2017-11-13 LAB — PATHOLOGY

## 2017-11-13 LAB — TISSUE SPECIMEN

## 2017-11-15 ENCOUNTER — Other Ambulatory Visit: Payer: Self-pay | Admitting: Obstetrics & Gynecology

## 2017-11-27 ENCOUNTER — Telehealth: Payer: Self-pay

## 2017-12-21 LAB — COLOGUARD

## 2018-01-29 ENCOUNTER — Other Ambulatory Visit: Payer: Self-pay | Admitting: *Deleted

## 2018-01-29 MED ORDER — CONJ ESTROG-MEDROXYPROGEST ACE 0.3-1.5 MG PO TABS: 1.0000 | ORAL_TABLET | Freq: Every day | ORAL | 3 refills | Status: DC

## 2018-02-12 ENCOUNTER — Ambulatory Visit (INDEPENDENT_AMBULATORY_CARE_PROVIDER_SITE_OTHER): Payer: 59 | Admitting: Family Medicine

## 2018-02-12 ENCOUNTER — Encounter: Payer: Self-pay | Admitting: Family Medicine

## 2018-02-12 VITALS — BP 110/68 | HR 68 | Temp 98.7°F | Ht 65.0 in | Wt 146.0 lb

## 2018-02-12 DIAGNOSIS — R635 Abnormal weight gain: Secondary | ICD-10-CM

## 2018-02-12 DIAGNOSIS — N951 Menopausal and female climacteric states: Secondary | ICD-10-CM | POA: Diagnosis not present

## 2018-02-12 DIAGNOSIS — E559 Vitamin D deficiency, unspecified: Secondary | ICD-10-CM | POA: Diagnosis not present

## 2018-02-12 LAB — LUTEINIZING HORMONE: LH: 42.16 m[IU]/mL

## 2018-02-12 LAB — FOLLICLE STIMULATING HORMONE: FSH: 73.5 m[IU]/mL

## 2018-02-12 LAB — TSH: TSH: 1.19 u[IU]/mL (ref 0.35–4.50)

## 2018-02-12 LAB — VITAMIN D 25 HYDROXY (VIT D DEFICIENCY, FRACTURES): VITD: 54.15 ng/mL (ref 30.00–100.00)

## 2018-02-12 NOTE — Progress Notes (Signed)
Subjective:   Patient ID: Jill Price, female    DOB: 02-18-1966, 52 y.o.   MRN: [id number]  Jill Price is a pleasant 52 y.o. year old female who presents to clinic today with Follow-up (Would like to discuss weight gain-thinks it is a side effect from Prempro. Discuss which multivitamin to take-was taking centrum silver.  Would like labs to check Vitamin D levels.)  on 02/12/2018  HPI:  She is doing really well.  Got a job here in [place] at [employer].  Has not been tired or achy. Vitamin D deficiency- still taking 50,000 IU of vitamin D weekly along with a calcium/ Vit D supplement.  She is frustrated with 13 pound weight gain over the past year. She wasn't happy in [place], wasn't as active and snacking more. At her current job, she will be able to walk more and make better food choices.  She wants to make sure her weight gain isn't caused by the prempro. Wt Readings from Last 3 Encounters:  02/12/18 146 lb (66.2 kg)  11/09/17 143 lb 4.8 oz (65 kg)  10/03/17 143 lb (64.9 kg)   Current Outpatient Medications on File Prior to Visit  Medication Sig Dispense Refill  . estrogen, conjugated,-medroxyprogesterone (PREMPRO) 0.3-1.5 MG tablet Take 1 tablet by mouth daily. 30 tablet 3  . valACYclovir (VALTREX) 1000 MG tablet Take one table by mouth daily 3-5 days for outbreak, then daily as needed. 90 tablet 3  . Vitamin D, Ergocalciferol, (DRISDOL) 50000 units CAPS capsule Take 1 capsule (50,000 Units total) by mouth every 7 (seven) days. 12 capsule 1   No current facility-administered medications on file prior to visit.     No Known Allergies  Past Medical History:  Diagnosis Date  . Depression   . Frequent headaches   . Migraines     No past surgical history on file.  Family History  Problem Relation Age of Onset  . Heart disease Father   . Dementia Father   . Breast cancer Neg Hx     Social History   Socioeconomic History  . Marital status: Single     Spouse name: Not on file  . Number of children: Not on file  . Years of education: Not on file  . Highest education level: Not on file  Occupational History  . Not on file  Social Needs  . Financial resource strain: Not on file  . Food insecurity:    Worry: Not on file    Inability: Not on file  . Transportation needs:    Medical: Not on file    Non-medical: Not on file  Tobacco Use  . Smoking status: Never Smoker  . Smokeless tobacco: Never Used  Substance and Sexual Activity  . Alcohol use: No  . Drug use: No  . Sexual activity: Not Currently    Birth control/protection: None    Comment: 1st intercourse- 61, partners- 4,   Lifestyle  . Physical activity:    Days per week: Not on file    Minutes per session: Not on file  . Stress: Not on file  Relationships  . Social connections:    Talks on phone: Not on file    Gets together: Not on file    Attends religious service: Not on file    Active member of club or organization: Not on file    Attends meetings of clubs or organizations: Not on file    Relationship status: Not on file  .  Intimate partner violence:    Fear of current or ex partner: Not on file    Emotionally abused: Not on file    Physically abused: Not on file    Forced sexual activity: Not on file  Other Topics Concern  . 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   Review of Systems  Constitutional: Negative.   HENT: Negative.   Respiratory: Negative.   Cardiovascular: Negative.   Gastrointestinal: Negative.   Endocrine: Negative.   Genitourinary: Negative.   Musculoskeletal: Negative.   Allergic/Immunologic: Negative.   Neurological: Negative.   Hematological: Negative.   Psychiatric/Behavioral: Negative.   All other systems reviewed and are negative.        Objective:    BP 110/68   Pulse 68   Temp 98.7 F (37.1 C) (Oral)   Ht 5\' 5"  (1.651 m)   Wt 146 lb (66.2 kg)   SpO2 98%   BMI 24.30 kg/m    Physical Exam  Constitutional: She is oriented to person, place, and time. She appears well-developed and well-nourished. No distress.  HENT:  Head: Normocephalic and atraumatic.  Eyes: EOM are normal.  Neck: Normal range of motion.  Cardiovascular: Regular rhythm.  Pulmonary/Chest: Effort normal.  Musculoskeletal: Normal range of motion.  Neurological: She is alert and oriented to person, place, and time. No cranial nerve deficit.  Skin: Skin is warm and dry. She is not diaphoretic.  Psychiatric: She has a normal mood and affect. Her behavior is normal. Judgment and thought content normal.  Nursing note and vitals reviewed.         Assessment & Plan:   Vitamin D deficiency  Weight gain No follow-ups on file.

## 2018-02-12 NOTE — Patient Instructions (Signed)
Great to see you.  You look great!!!  Congratulations!! I will call you with your lab results from today and you can view them online.

## 2018-02-12 NOTE — Assessment & Plan Note (Signed)
>  15 minutes spent in face to face time with patient, >50% spent in counselling or coordination of care. Likely multifactorial- stress eating, decreased physical activity and less likely from prempro.  She is making big changes in her life style now that she is back in [place] and will keep me updated.

## 2018-02-12 NOTE — Assessment & Plan Note (Signed)
Feeling better on current dose of vitamin D. Check Vit D today.

## 2018-02-26 NOTE — Telephone Encounter (Signed)
error 

## 2018-03-23 ENCOUNTER — Encounter: Payer: Self-pay | Admitting: Family Medicine

## 2018-03-23 ENCOUNTER — Ambulatory Visit (INDEPENDENT_AMBULATORY_CARE_PROVIDER_SITE_OTHER): Payer: 59 | Admitting: Family Medicine

## 2018-03-23 VITALS — BP 128/72 | HR 85 | Temp 98.5°F | Ht 65.0 in | Wt 145.0 lb

## 2018-03-23 DIAGNOSIS — H66001 Acute suppurative otitis media without spontaneous rupture of ear drum, right ear: Secondary | ICD-10-CM | POA: Diagnosis not present

## 2018-03-23 MED ORDER — AMOXICILLIN-POT CLAVULANATE 875-125 MG PO TABS: 1.0000 | ORAL_TABLET | Freq: Two times a day (BID) | ORAL | 0 refills | Status: DC

## 2018-03-23 NOTE — Patient Instructions (Signed)

## 2018-03-23 NOTE — Assessment & Plan Note (Signed)
-  Treat with augmentin x10 days -Increased fluid intake -Call if not improving.

## 2018-03-23 NOTE — Progress Notes (Signed)
Jill Price - 52 y.o. female MRN [id number]  Date of birth: 02-Nov-1965  Subjective Chief Complaint  Patient presents with  . Otalgia    woke up with right ear pain-denies fevers    HPI Jill Price is a 52 y.o. female here today with complaint of R ear pain.  She states that she was woken up at around 1:30 am today with R ear pain.  She has had some associated congestion but denies sinus pain.  She took some decongestant medication this morning which helped with congestion but did not improve ear pain.  She denies fever, chills, changes to hearing, or dizziness.   ROS:  A comprehensive ROS was completed and negative except as noted per HPI  No Known Allergies  Past Medical History:  Diagnosis Date  . Depression   . Frequent headaches   . Migraines     No past surgical history on file.  Social History   Socioeconomic History  . Marital status: Single    Spouse name: Not on file  . Number of children: Not on file  . Years of education: Not on file  . Highest education level: Not on file  Occupational History  . Not on file  Social Needs  . Financial resource strain: Not on file  . Food insecurity:    Worry: Not on file    Inability: Not on file  . Transportation needs:    Medical: Not on file    Non-medical: Not on file  Tobacco Use  . Smoking status: Never Smoker  . Smokeless tobacco: Never Used  Substance and Sexual Activity  . Alcohol use: No  . Drug use: No  . Sexual activity: Not Currently    Birth control/protection: None    Comment: 1st intercourse- 74, partners- 4,   Lifestyle  . Physical activity:    Days per week: Not on file    Minutes per session: Not on file  . Stress: Not on file  Relationships  . Social connections:    Talks on phone: Not on file    Gets together: Not on file    Attends religious service: Not on file    Active member of club or organization: Not on file    Attends meetings of clubs or organizations: Not on  file    Relationship status: Not on file  Other Topics Concern  . 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 Heart disease Father   . Dementia Father   . Breast cancer Neg Hx     Health Maintenance  Topic Date Due  . Hepatitis C Screening  18-Jan-1966  . INFLUENZA VACCINE  01/16/2019 (Originally 11/02/2017)  . TETANUS/TDAP  02/13/2019 (Originally 02/02/1983)  . HIV Screening  02/13/2019 (Originally 02/02/1979)  . MAMMOGRAM  07/15/2018  . PAP SMEAR-Modifier  10/03/2020  . Fecal DNA (Cologuard)  12/21/2020    ----------------------------------------------------------------------------------------------------------------------------------------------------------------------------------------------------------------- Physical Exam BP 128/72   Pulse 85   Temp 98.5 F (36.9 C) (Oral)   Ht 5\' 5"  (1.651 m)   Wt 145 lb (65.8 kg)   SpO2 99%   BMI 24.13 kg/m   Physical Exam Constitutional:      Appearance: Normal appearance.  HENT:     Head: Normocephalic and atraumatic.     Right Ear: Ear canal normal.     Left Ear: Tympanic membrane and ear canal  normal.     Ears:     Comments: R TM injected with purulent appearing effusion.    Nose: No congestion.     Mouth/Throat:     Mouth: Mucous membranes are moist.  Eyes:     General: No scleral icterus. Neck:     Musculoskeletal: Neck supple.  Pulmonary:     Effort: Pulmonary effort is normal.     Breath sounds: Normal breath sounds.  Lymphadenopathy:     Cervical: Cervical adenopathy present.  Neurological:     General: No focal deficit present.     Mental Status: She is alert and oriented to person, place, and time.  Psychiatric:        Mood and Affect: Mood normal.      ------------------------------------------------------------------------------------------------------------------------------------------------------------------------------------------------------------------- Assessment and Plan  Non-recurrent acute suppurative otitis media of right ear without spontaneous rupture of tympanic membrane -Treat with augmentin x10 days -Increased fluid intake -Call if not improving.

## 2018-03-27 ENCOUNTER — Other Ambulatory Visit: Payer: Self-pay | Admitting: Family Medicine

## 2018-03-27 MED ORDER — CONJ ESTROG-MEDROXYPROGEST ACE 0.3-1.5 MG PO TABS: 1.0000 | ORAL_TABLET | Freq: Every day | ORAL | 3 refills | Status: DC

## 2018-03-27 NOTE — Telephone Encounter (Signed)
Requested medication (s) are due for refill today: no; but pt. requesting pharmacy change  Requested medication (s) are on the active medication list:  yes  Future visit scheduled:  no  Last Refill: 11/09/17; #12; RF x1  Requesting to change Rx to [facility] on [address].    Requested Prescriptions  Pending Prescriptions Disp Refills   Vitamin D, Ergocalciferol, (DRISDOL) 1.25 MG (50000 UT) CAPS capsule 12 capsule 1    Sig: Take 1 capsule (50,000 Units total) by mouth every 7 (seven) days.     Endocrinology:  Vitamins - Vitamin D Supplementation Failed - 03/27/2018 10:47 AM      Failed - 50,000 IU strengths are not delegated      Failed - Phosphate in normal range and within 360 days    No results found for: PHOS       Failed - Vit D in normal range and within 360 days    VITD  Date Value Ref Range Status  02/12/2018 54.15 30.00 - 100.00 ng/mL Final         Passed - Ca in normal range and within 360 days    Calcium  Date Value Ref Range Status  08/07/2017 9.5 8.6 - 10.4 mg/dL Final  08/07/2017 9.4 8.4 - 10.5 mg/dL Final         Passed - Valid encounter within last 12 months    Recent Outpatient Visits          4 days ago Non-recurrent acute suppurative otitis media of right ear without spontaneous rupture of tympanic membrane   [facility] [place], [place], DO   1 month ago Vitamin D deficiency   [facility] [place], [place], MD   4 months ago Bilateral hand pain   [facility] [place], Marciano Sequin, MD   7 months ago Menopausal symptoms   [facility] [place], Marciano Sequin, MD   7 months ago Bilateral hand pain   [facility] [place], Marciano Sequin, MD           Signed Prescriptions Disp Refills   estrogen, conjugated,-medroxyprogesterone (PREMPRO) 0.3-1.5 MG tablet 90 tablet 3    Sig: Take 1 tablet by mouth daily.     OB/GYN:  Hormone Combinations Passed - 03/27/2018  10:47 AM      Passed - Mammogram is up-to-date per Health Maintenance      Passed - Last BP in normal range    BP Readings from Last 1 Encounters:  03/23/18 128/72         Passed - Valid encounter within last 12 months    Recent Outpatient Visits          4 days ago Non-recurrent acute suppurative otitis media of right ear without spontaneous rupture of tympanic membrane   [facility] [place], [place], DO   1 month ago Vitamin D deficiency   [facility] [place], Marciano Sequin, MD   4 months ago Bilateral hand pain   [facility] [place], Marciano Sequin, MD   7 months ago Menopausal symptoms   [facility] [address] Marciano Sequin, MD   7 months ago Bilateral hand pain   [facility] [address], Marciano Sequin, MD

## 2018-03-27 NOTE — Telephone Encounter (Signed)
Phone call to pt. To discuss need for refills on Vita D and Prempro.  Noted [facility]'s [facility] at [facility] [address].  Pt. Stated her insurance and financial situation has changed.  Verb. Being dissatisfied with [facility].  Is requesting to have the above Rx's transferred to [facility]. On [address]  Pt. Stated she is eager to get the prescriptions transferred.

## 2018-03-27 NOTE — Telephone Encounter (Signed)
Copied from [place] [phone]. Topic: Quick Communication - Rx Refill/Question >> Mar 27, 2018 10:32 AM Alfredia Ferguson R wrote: Medication: Vitamin D, Ergocalciferol, (DRISDOL) 50000 units CAPS capsule , estrogen, conjugated,-medroxyprogesterone (PREMPRO) 0.3-1.5 MG tablet  Has the patient contacted their pharmacy? Yes Preferred Pharmacy (with phone number or street name): [facility] [address] [place] - [address] [phone] (Phone) [phone] (Fax)    Agent: Please be advised that RX refills may take up to 3 business days. We ask that you follow-up with your pharmacy.

## 2018-03-30 MED ORDER — VITAMIN D (ERGOCALCIFEROL) 1.25 MG (50000 UNIT) PO CAPS: 50000.0000 [IU] | ORAL_CAPSULE | ORAL | 1 refills | Status: DC

## 2018-04-11 ENCOUNTER — Other Ambulatory Visit: Payer: Self-pay | Admitting: Family Medicine

## 2018-04-11 MED ORDER — DOXYCYCLINE HYCLATE 100 MG PO TABS: 100.0000 mg | ORAL_TABLET | Freq: Two times a day (BID) | ORAL | 0 refills | Status: DC

## 2018-04-17 ENCOUNTER — Telehealth: Payer: Self-pay

## 2018-04-17 DIAGNOSIS — H66007 Acute suppurative otitis media without spontaneous rupture of ear drum, recurrent, unspecified ear: Secondary | ICD-10-CM

## 2018-04-17 MED ORDER — OSELTAMIVIR PHOSPHATE 75 MG PO CAPS: 75.0000 mg | ORAL_CAPSULE | Freq: Every day | ORAL | 0 refills | Status: AC

## 2018-04-17 NOTE — Telephone Encounter (Signed)
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

## 2018-05-30 ENCOUNTER — Encounter: Payer: Self-pay | Admitting: Family Medicine

## 2018-05-30 ENCOUNTER — Ambulatory Visit (INDEPENDENT_AMBULATORY_CARE_PROVIDER_SITE_OTHER): Payer: 59 | Admitting: Family Medicine

## 2018-05-30 DIAGNOSIS — B351 Tinea unguium: Secondary | ICD-10-CM | POA: Diagnosis not present

## 2018-05-30 HISTORY — DX: Tinea unguium: B35.1

## 2018-05-30 MED ORDER — TERBINAFINE HCL 250 MG PO TABS: 250.0000 mg | ORAL_TABLET | Freq: Every day | ORAL | 3 refills | Status: AC

## 2018-05-30 NOTE — Assessment & Plan Note (Signed)
New- no signs of paronychia. Treat with oral lamasil for 6 weeks and follow up in 6 weeks. The patient indicates understanding of these issues and agrees with the plan. Repeat LFTs at that time.

## 2018-05-30 NOTE — Patient Instructions (Signed)
We are starting Lamisil 250 mg daily for 6 weeks. Come see min 6 weeks.

## 2018-05-30 NOTE — Progress Notes (Signed)
Subjective:   Patient ID: Jill Price, female    DOB: June 11, 1965, 53 y.o.   MRN: [id number]  Jill Price is a pleasant 53 y.o. year old female who presents to clinic today with Fingernail Issue (Patient is here today C/O possible fungal infection on finger nail.)  on 05/30/2018  HPI:  Took her nail polish off and noticed all of her finger nails were discolored and thickened.  NO toe nail involvement.  Current Outpatient Medications on File Prior to Visit  Medication Sig Dispense Refill  . estrogen, conjugated,-medroxyprogesterone (PREMPRO) 0.3-1.5 MG tablet Take 1 tablet by mouth daily. 90 tablet 3  . valACYclovir (VALTREX) 1000 MG tablet Take one table by mouth daily 3-5 days for outbreak, then daily as needed. 90 tablet 3  . Vitamin D, Ergocalciferol, (DRISDOL) 1.25 MG (50000 UT) CAPS capsule Take 1 capsule (50,000 Units total) by mouth every 7 (seven) days. 12 capsule 1   No current facility-administered medications on file prior to visit.     No Known Allergies  Past Medical History:  Diagnosis Date  . Depression   . Frequent headaches   . Migraines     No past surgical history on file.  Family History  Problem Relation Age of Onset  . Heart disease Father   . Dementia Father   . Breast cancer Neg Hx     Social History   Socioeconomic History  . Marital status: Single    Spouse name: Not on file  . Number of children: Not on file  . Years of education: Not on file  . Highest education level: Not on file  Occupational History  . Not on file  Social Needs  . Financial resource strain: Not on file  . Food insecurity:    Worry: Not on file    Inability: Not on file  . Transportation needs:    Medical: Not on file    Non-medical: Not on file  Tobacco Use  . Smoking status: Never Smoker  . Smokeless tobacco: Never Used  Substance and Sexual Activity  . Alcohol use: No  . Drug use: No  . Sexual activity: Not Currently    Birth  control/protection: None    Comment: 1st intercourse- 26, partners- 4,   Lifestyle  . Physical activity:    Days per week: Not on file    Minutes per session: Not on file  . Stress: Not on file  Relationships  . Social connections:    Talks on phone: Not on file    Gets together: Not on file    Attends religious service: Not on file    Active member of club or organization: Not on file    Attends meetings of clubs or organizations: Not on file    Relationship status: Not on file  . Intimate partner violence:    Fear of current or ex partner: Not on file    Emotionally abused: Not on file    Physically abused: Not on file    Forced sexual activity: Not on file  Other Topics Concern  . 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   Review of Systems  Skin: Positive for rash.  All other systems reviewed and are negative.      Objective:    BP 130/80 (BP  Location: Left Arm, Patient Position: Sitting, Cuff Size: Normal)   Pulse 67   Temp 98.6 F (37 C) (Oral)   Ht 5\' 5"  (1.651 m)   Wt 149 lb 6.4 oz (67.8 kg)   SpO2 98%   BMI 24.86 kg/m    Physical Exam Vitals signs and nursing note reviewed.  Constitutional:      Appearance: Normal appearance.  HENT:     Head: Normocephalic and atraumatic.     Right Ear: External ear normal.     Left Ear: External ear normal.     Nose: Nose normal.     Mouth/Throat:     Mouth: Mucous membranes are moist.  Neck:     Musculoskeletal: Normal range of motion.  Cardiovascular:     Rate and Rhythm: Normal rate.     Pulses: Normal pulses.  Pulmonary:     Effort: Pulmonary effort is normal.     Breath sounds: Normal breath sounds.  Musculoskeletal: Normal range of motion.  Skin:    General: Skin is warm and dry.       Neurological:     General: No  focal deficit present.     Mental Status: She is alert and oriented to person, place, and time.  Psychiatric:        Mood and Affect: Mood normal.        Behavior: Behavior normal.        Thought Content: Thought content normal.        Judgment: Judgment normal.           Assessment & Plan:   Onychomycosis Return in about 6 weeks (around 07/11/2018).

## 2018-07-11 ENCOUNTER — Ambulatory Visit: Payer: Self-pay | Admitting: Family Medicine

## 2018-07-16 ENCOUNTER — Other Ambulatory Visit: Payer: Self-pay

## 2018-07-16 MED ORDER — PREMPRO 0.3-1.5 MG PO TABS: 1.0000 | ORAL_TABLET | Freq: Every day | ORAL | 3 refills | Status: DC

## 2018-08-18 ENCOUNTER — Other Ambulatory Visit: Payer: Self-pay | Admitting: Family Medicine

## 2018-08-20 NOTE — Telephone Encounter (Signed)
TA-Plz see refill req/Her Vit-Karri Kallenbach is in the 50's/plz advise/thx dmf

## 2018-08-28 ENCOUNTER — Telehealth: Payer: Self-pay

## 2018-08-28 ENCOUNTER — Other Ambulatory Visit: Payer: Self-pay | Admitting: Family Medicine

## 2018-08-28 DIAGNOSIS — E559 Vitamin D deficiency, unspecified: Secondary | ICD-10-CM

## 2018-08-28 DIAGNOSIS — R5383 Other fatigue: Secondary | ICD-10-CM

## 2018-08-28 DIAGNOSIS — Z79890 Hormone replacement therapy (postmenopausal): Secondary | ICD-10-CM

## 2018-08-28 NOTE — Telephone Encounter (Signed)
Per Dr. Aron/I tried to call pt to schedule her for a lab visit/then a couple days following that she wants to have a Virtual/phone visit to discuss those labs/thx dmf

## 2018-08-29 ENCOUNTER — Other Ambulatory Visit (INDEPENDENT_AMBULATORY_CARE_PROVIDER_SITE_OTHER): Payer: 59

## 2018-08-29 ENCOUNTER — Other Ambulatory Visit: Payer: Self-pay

## 2018-08-29 DIAGNOSIS — R5383 Other fatigue: Secondary | ICD-10-CM

## 2018-08-29 DIAGNOSIS — E559 Vitamin D deficiency, unspecified: Secondary | ICD-10-CM | POA: Diagnosis not present

## 2018-08-29 DIAGNOSIS — Z79890 Hormone replacement therapy (postmenopausal): Secondary | ICD-10-CM

## 2018-08-29 NOTE — Progress Notes (Signed)
Pt did not want labs ordered associated with fatigue as she no longer has that problem/thx dmf

## 2018-08-29 NOTE — Addendum Note (Signed)
Addended by: Lynnea Ferrier on: 08/29/2018 08:03 AM   Modules accepted: Orders

## 2018-08-30 LAB — COMPREHENSIVE METABOLIC PANEL
AG Ratio: 1.6 (calc) (ref 1.0–2.5)
ALT: 14 U/L (ref 6–29)
AST: 16 U/L (ref 10–35)
Albumin: 4.2 g/dL (ref 3.6–5.1)
Alkaline phosphatase (APISO): 83 U/L (ref 37–153)
BUN: 14 mg/dL (ref 7–25)
CO2: 24 mmol/L (ref 20–32)
Calcium: 9.8 mg/dL (ref 8.6–10.4)
Chloride: 104 mmol/L (ref 98–110)
Creat: 0.67 mg/dL (ref 0.50–1.05)
Globulin: 2.7 g/dL (calc) (ref 1.9–3.7)
Glucose, Bld: 95 mg/dL (ref 65–99)
Potassium: 4.2 mmol/L (ref 3.5–5.3)
Sodium: 139 mmol/L (ref 135–146)
Total Bilirubin: 0.6 mg/dL (ref 0.2–1.2)
Total Protein: 6.9 g/dL (ref 6.1–8.1)

## 2018-08-30 LAB — CBC WITH DIFFERENTIAL/PLATELET
Absolute Monocytes: 450 cells/uL (ref 200–950)
Basophils Absolute: 53 cells/uL (ref 0–200)
Basophils Relative: 0.7 %
Eosinophils Absolute: 263 cells/uL (ref 15–500)
Eosinophils Relative: 3.5 %
HCT: 39 % (ref 35.0–45.0)
Hemoglobin: 13.2 g/dL (ref 11.7–15.5)
Lymphs Abs: 2235 cells/uL (ref 850–3900)
MCH: 29 pg (ref 27.0–33.0)
MCHC: 33.8 g/dL (ref 32.0–36.0)
MCV: 85.7 fL (ref 80.0–100.0)
MPV: 12.9 fL — ABNORMAL HIGH (ref 7.5–12.5)
Monocytes Relative: 6 %
Neutro Abs: 4500 cells/uL (ref 1500–7800)
Neutrophils Relative %: 60 %
Platelets: 261 10*3/uL (ref 140–400)
RBC: 4.55 10*6/uL (ref 3.80–5.10)
RDW: 12.4 % (ref 11.0–15.0)
Total Lymphocyte: 29.8 %
WBC: 7.5 10*3/uL (ref 3.8–10.8)

## 2018-08-30 LAB — VITAMIN D 25 HYDROXY (VIT D DEFICIENCY, FRACTURES): Vit D, 25-Hydroxy: 56 ng/mL (ref 30–100)

## 2018-08-30 LAB — LUTEINIZING HORMONE: LH: 43.3 m[IU]/mL

## 2018-08-30 LAB — FOLLICLE STIMULATING HORMONE: FSH: 79.8 m[IU]/mL

## 2018-08-31 NOTE — Progress Notes (Signed)
Virtual Visit via Video   Due to the COVID-19 pandemic, this visit was completed with telemedicine (audio/video) technology to reduce patient and provider exposure as well as to preserve personal protective equipment.   I connected with Jill Price by a video enabled telemedicine application and verified that I am speaking with the correct person using two identifiers. Location patient: Home Location provider: [facility], Office Persons participating in the virtual visit: Andrez Grime, MD   I discussed the limitations of evaluation and management by telemedicine and the availability of in person appointments. The patient expressed understanding and agreed to proceed.  Care Team   Patient Care Team: Lucille Passy, MD as PCP - General (Family Medicine)  Subjective:   HPI: Discuss labs- Vit D deficiency- taking 50 000 IU weekly.  Vit D drawn 5 days ago was 9.  Menopause symptoms- she has been on Prempro for a year now. She feels great and has lost 5 lbs in the last 60 days.  Lab Results  Component Value Date   WBC 7.5 08/29/2018   HGB 13.2 08/29/2018   HCT 39.0 08/29/2018   MCV 85.7 08/29/2018   PLT 261 08/29/2018   Lab Results  Component Value Date   ALT 14 08/29/2018   AST 16 08/29/2018   ALKPHOS 83 08/07/2017   BILITOT 0.6 08/29/2018   LH is 43.3, FSH 79.3, both stable.  Denies any post menopausal bleeding.  She is due for mammogram.  She no longer feels tired.  Review of Systems  Constitutional: Negative for malaise/fatigue.  HENT: Negative.   Eyes: Negative.   Respiratory: Negative.   Cardiovascular: Negative.   Gastrointestinal: Negative.   Genitourinary: Negative.   Musculoskeletal: Negative.   Skin: Negative.   Neurological: Negative.   Endo/Heme/Allergies: Negative.   Psychiatric/Behavioral: Negative.   All other systems reviewed and are negative.    Patient Active Problem List   Diagnosis Date Noted  .  Onychomycosis 05/30/2018  . Non-recurrent acute suppurative otitis media of right ear without spontaneous rupture of tympanic membrane 03/23/2018  . Weight gain 02/12/2018  . Vitamin D deficiency 08/03/2017  . Bilateral hand pain 07/13/2017  . Menopausal symptoms 07/13/2017    Social History   Tobacco Use  . Smoking status: Never Smoker  . Smokeless tobacco: Never Used  Substance Use Topics  . Alcohol use: No    Current Outpatient Medications:  .  PREMPRO 0.3-1.5 MG tablet, Take 1 tablet by mouth daily., Disp: 90 tablet, Rfl: 3 .  valACYclovir (VALTREX) 1000 MG tablet, Take one table by mouth daily 3-5 days for outbreak, then daily as needed., Disp: 90 tablet, Rfl: 3 .  Vitamin D, Ergocalciferol, (DRISDOL) 1.25 MG (50000 UT) CAPS capsule, TAKE 1 CAPSULE BY MOUTH EVERY 7 DAYS, Disp: 12 capsule, Rfl: 0  No Known Allergies  Objective:  Wt 143 lb (64.9 kg)   BMI 23.80 kg/m   Wt Readings from Last 3 Encounters:  09/03/18 143 lb (64.9 kg)  05/30/18 149 lb 6.4 oz (67.8 kg)  03/23/18 145 lb (65.8 kg)     VITALS: Per patient if applicable, see vitals. GENERAL: Alert, appears well and in no acute distress. HEENT: Atraumatic, conjunctiva clear, no obvious abnormalities on inspection of external nose and ears. NECK: Normal movements of the head and neck. CARDIOPULMONARY: No increased WOB. Speaking in clear sentences. I:E ratio WNL.  MS: Moves all visible extremities without noticeable abnormality. PSYCH: Pleasant and cooperative, well-groomed. Speech normal rate and  rhythm. Affect is appropriate. Insight and judgement are appropriate. Attention is focused, linear, and appropriate.  NEURO: CN grossly intact. Oriented as arrived to appointment on time with no prompting. Moves both UE equally.  SKIN: No obvious lesions, wounds, erythema, or cyanosis noted on face or hands.  Depression screen PHQ 2/9 05/30/2018  Decreased Interest 0  Down, Depressed, Hopeless 0  PHQ - 2 Score 0     Assessment and Plan:   Jill Price was seen today for discuss results.  Diagnoses and all orders for this visit:  Vitamin D deficiency  Menopausal symptoms  Other orders -     Vitamin D, Ergocalciferol, (DRISDOL) 1.25 MG (50000 UT) CAPS capsule; TAKE 1 CAPSULE BY MOUTH EVERY 7 DAYS      . COVID-19 Education: The signs and symptoms of COVID-19 were discussed with the patient and how to seek care for testing if needed. The importance of social distancing was discussed today. . Reviewed expectations re: course of current medical issues. . Discussed self-management of symptoms. . Outlined signs and symptoms indicating need for more acute intervention. . Patient verbalized understanding and all questions were answered. Marland Kitchen Health Maintenance issues including appropriate healthy diet, exercise, and smoking avoidance were discussed with patient. . See orders for this visit as documented in the electronic medical record.  Arnette Norris, MD  Records requested if needed. Time spent: 25 minutes, of which >50% was spent in obtaining information about her symptoms, reviewing her previous labs, evaluations, and treatments, counseling her about her condition (please see the discussed topics above), and developing a plan to further investigate it; she had a number of questions which I addressed.

## 2018-09-03 ENCOUNTER — Encounter: Payer: Self-pay | Admitting: Family Medicine

## 2018-09-03 ENCOUNTER — Ambulatory Visit (INDEPENDENT_AMBULATORY_CARE_PROVIDER_SITE_OTHER): Payer: 59 | Admitting: Family Medicine

## 2018-09-03 VITALS — Wt 143.0 lb

## 2018-09-03 DIAGNOSIS — N951 Menopausal and female climacteric states: Secondary | ICD-10-CM

## 2018-09-03 DIAGNOSIS — E559 Vitamin D deficiency, unspecified: Secondary | ICD-10-CM

## 2018-09-03 DIAGNOSIS — Z1231 Encounter for screening mammogram for malignant neoplasm of breast: Secondary | ICD-10-CM

## 2018-09-03 DIAGNOSIS — Z79890 Hormone replacement therapy (postmenopausal): Secondary | ICD-10-CM

## 2018-09-03 DIAGNOSIS — Z1239 Encounter for other screening for malignant neoplasm of breast: Secondary | ICD-10-CM | POA: Diagnosis not present

## 2018-09-03 MED ORDER — VITAMIN D (ERGOCALCIFEROL) 1.25 MG (50000 UNIT) PO CAPS: ORAL_CAPSULE | ORAL | 0 refills | Status: DC

## 2018-09-03 NOTE — Assessment & Plan Note (Signed)
She feels current dose of Vit D is working well for her- did discuss importance of rechecking her Vit D in 6 months as I do not want her to develop Vit D toxicity . We discussed making sure there is not vitamin d in her other supplements or vitamins since she is currently taking Vit D 50000 IU weekly. >25 minutes spent in face to face time with patient, >50% spent in counselling or coordination of care discussing Vit D deficiency and HRT.

## 2018-09-03 NOTE — Assessment & Plan Note (Signed)
She is very pleased with prempro.  Labs reassuring.  Mammogram ordered- she will schedule this.  No post menopausal bleeding.  We did discuss increased risk of blood clots, strokes, CA. She is aware will be open to discussing a wean in 6 months at next OV.

## 2018-09-20 ENCOUNTER — Other Ambulatory Visit: Payer: Self-pay

## 2018-09-20 ENCOUNTER — Encounter: Payer: Self-pay | Admitting: Family Medicine

## 2018-09-20 ENCOUNTER — Other Ambulatory Visit: Payer: Self-pay | Admitting: Family Medicine

## 2018-09-20 MED ORDER — VITAMIN D (ERGOCALCIFEROL) 1.25 MG (50000 UNIT) PO CAPS: ORAL_CAPSULE | ORAL | 0 refills | Status: DC

## 2018-09-23 ENCOUNTER — Encounter: Payer: Self-pay | Admitting: Family Medicine

## 2018-09-24 ENCOUNTER — Encounter: Payer: Self-pay | Admitting: Family Medicine

## 2018-09-25 ENCOUNTER — Encounter: Payer: Self-pay | Admitting: Family Medicine

## 2018-09-27 ENCOUNTER — Encounter: Payer: Self-pay | Admitting: Family Medicine

## 2018-10-08 ENCOUNTER — Encounter: Payer: Self-pay | Admitting: Family Medicine

## 2018-10-09 ENCOUNTER — Telehealth: Payer: Self-pay | Admitting: Family Medicine

## 2018-10-09 NOTE — Telephone Encounter (Signed)
Patient picked up prempro medicine.

## 2018-10-11 ENCOUNTER — Encounter: Payer: Self-pay | Admitting: Obstetrics & Gynecology

## 2018-10-19 ENCOUNTER — Other Ambulatory Visit: Payer: Self-pay | Admitting: Family Medicine

## 2018-10-19 ENCOUNTER — Ambulatory Visit: Payer: 59

## 2018-10-22 NOTE — Telephone Encounter (Signed)
Dr. Aron - please advise 

## 2018-11-28 ENCOUNTER — Encounter: Payer: Self-pay | Admitting: Obstetrics & Gynecology

## 2018-11-28 ENCOUNTER — Ambulatory Visit (INDEPENDENT_AMBULATORY_CARE_PROVIDER_SITE_OTHER): Payer: 59 | Admitting: Obstetrics & Gynecology

## 2018-11-28 ENCOUNTER — Other Ambulatory Visit: Payer: Self-pay

## 2018-11-28 VITALS — BP 124/76 | Ht 65.0 in | Wt 141.0 lb

## 2018-11-28 DIAGNOSIS — N870 Mild cervical dysplasia: Secondary | ICD-10-CM | POA: Diagnosis not present

## 2018-11-28 DIAGNOSIS — Z01419 Encounter for gynecological examination (general) (routine) without abnormal findings: Secondary | ICD-10-CM | POA: Diagnosis not present

## 2018-11-28 DIAGNOSIS — Z780 Asymptomatic menopausal state: Secondary | ICD-10-CM | POA: Diagnosis not present

## 2018-11-28 MED ORDER — PREMPRO 0.3-1.5 MG PO TABS: 1.0000 | ORAL_TABLET | Freq: Every day | ORAL | 4 refills | Status: DC

## 2018-11-28 NOTE — Progress Notes (Signed)
Jill Price 06/11/1965 [id number]   History:    53 y.o. G1P1L1 Divorced.  Son lives in [place] with his father.  RP:  Established patient presenting for annual gyn exam   HPI: Sexually active.  Health labs with Fam MD.  Past medical history,surgical history, family history and social history were all reviewed and documented in the EPIC chart.  Gynecologic History Postmenopause Contraception: post menopausal status Last Pap: 10/2017. Results were: LGSIL/HPV HR pos.  HPV 16-18-45 negative.  Colpo CIN 1 11/2017. Last mammogram: 07/2016. Results were: Negative Bone Density: Never Colonoscopy: Cologard negative 2019  Obstetric History OB History  Gravida Para Term Preterm AB Living  1 1       1   SAB TAB Ectopic Multiple Live Births               # Outcome Date GA Lbr Len/2nd Weight Sex Delivery Anes PTL Lv  1 Para              ROS: A ROS was performed and pertinent positives and negatives are included in the history.  GENERAL: No fevers or chills. HEENT: No change in vision, no earache, sore throat or sinus congestion. NECK: No pain or stiffness. CARDIOVASCULAR: No chest pain or pressure. No palpitations. PULMONARY: No shortness of breath, cough or wheeze. GASTROINTESTINAL: No abdominal pain, nausea, vomiting or diarrhea, melena or bright red blood per rectum. GENITOURINARY: No urinary frequency, urgency, hesitancy or dysuria. MUSCULOSKELETAL: No joint or muscle pain, no back pain, no recent trauma. DERMATOLOGIC: No rash, no itching, no lesions. ENDOCRINE: No polyuria, polydipsia, no heat or cold intolerance. No recent change in weight. HEMATOLOGICAL: No anemia or easy bruising or bleeding. NEUROLOGIC: No headache, seizures, numbness, tingling or weakness. PSYCHIATRIC: No depression, no loss of interest in normal activity or change in sleep pattern.     Exam:   BP 124/76   Ht 5\' 5"  (1.651 m)   Wt 141 lb (64 kg)   BMI 23.46 kg/m   Body mass index is 23.46 kg/m.   General appearance : Well developed well nourished female. No acute distress HEENT: Eyes: no retinal hemorrhage or exudates,  Neck supple, trachea midline, no carotid bruits, no thyroidmegaly Lungs: Clear to auscultation, no rhonchi or wheezes, or rib retractions  Heart: Regular rate and rhythm, no murmurs or gallops Breast:Examined in sitting and supine position were symmetrical in appearance, no palpable masses or tenderness,  no skin retraction, no nipple inversion, no nipple discharge, no skin discoloration, no axillary or supraclavicular lymphadenopathy Abdomen: no palpable masses or tenderness, no rebound or guarding Extremities: no edema or skin discoloration or tenderness  Pelvic: Vulva: Normal             Vagina: No gross lesions or discharge  Cervix: No gross lesions or discharge.  Pap/HPV HR done.  Uterus  AV, normal size, shape and consistency, non-tender and mobile  Adnexa  Without masses or tenderness  Anus: Normal   Assessment/Plan:  53 y.o. female for annual exam   1. Encounter for gynecological examination with Papanicolaou smear of cervix Normal gynecologic exam.  Pap test July 2019 showed LGSIL, colposcopy showed CIN-1.  Repeat Pap test with high-risk HPV today.  Breast exam normal.  Screening mammogram to schedule now.  Good body mass index at 23.46.  Continue with fitness and healthy nutrition.  Health labs with family physician. - Pap IG w/ reflex to HPV when ASC-U  2. Dysplasia of cervix, low grade (CIN 1)  3. Postmenopause Postmenopausal, well on low-dose Prempro.  No postmenopausal bleeding.  We will continue hormone replacement therapy, prescription sent to pharmacy.  Recommend vitamin D supplements, calcium intake of 1200 mg daily and regular weightbearing physical activities.  Other orders - PREMPRO 0.3-1.5 MG tablet; Take 1 tablet by mouth daily.  [name] MD, 3:59 PM 11/28/2018

## 2018-11-30 LAB — PAP IG W/ RFLX HPV ASCU

## 2018-12-01 ENCOUNTER — Encounter: Payer: Self-pay | Admitting: Obstetrics & Gynecology

## 2018-12-01 NOTE — Patient Instructions (Signed)
1. Encounter for gynecological examination with Papanicolaou smear of cervix Normal gynecologic exam.  Pap test July 2019 showed LGSIL, colposcopy showed CIN-1.  Repeat Pap test with high-risk HPV today.  Breast exam normal.  Screening mammogram to schedule now.  Good body mass index at 23.46.  Continue with fitness and healthy nutrition.  Health labs with family physician. - Pap IG w/ reflex to HPV when ASC-U  2. Dysplasia of cervix, low grade (CIN 1)  3. Postmenopause Postmenopausal, well on low-dose Prempro.  No postmenopausal bleeding.  We will continue hormone replacement therapy, prescription sent to pharmacy.  Recommend vitamin D supplements, calcium intake of 1200 mg daily and regular weightbearing physical activities.  Other orders - PREMPRO 0.3-1.5 MG tablet; Take 1 tablet by mouth daily.  Jill Price, it was a pleasure seeing you today!  I will inform you of your results as soon as they are available.

## 2018-12-03 ENCOUNTER — Encounter: Payer: Self-pay | Admitting: Family Medicine

## 2018-12-04 ENCOUNTER — Ambulatory Visit
Admission: RE | Admit: 2018-12-04 | Discharge: 2018-12-04 | Disposition: A | Discharge status: Home / Self Care | Payer: 59 | Source: Ambulatory Visit | Attending: Family Medicine | Admitting: Family Medicine

## 2018-12-04 ENCOUNTER — Other Ambulatory Visit: Payer: Self-pay

## 2018-12-04 DIAGNOSIS — Z1239 Encounter for other screening for malignant neoplasm of breast: Secondary | ICD-10-CM

## 2018-12-06 ENCOUNTER — Other Ambulatory Visit: Payer: Self-pay | Admitting: Family Medicine

## 2018-12-06 MED ORDER — PREMPRO 0.3-1.5 MG PO TABS: 1.0000 | ORAL_TABLET | Freq: Every day | ORAL | 4 refills | Status: DC

## 2018-12-20 ENCOUNTER — Other Ambulatory Visit: Payer: Self-pay

## 2018-12-21 ENCOUNTER — Ambulatory Visit (INDEPENDENT_AMBULATORY_CARE_PROVIDER_SITE_OTHER): Payer: 59 | Admitting: Obstetrics & Gynecology

## 2018-12-21 ENCOUNTER — Encounter: Payer: Self-pay | Admitting: Obstetrics & Gynecology

## 2018-12-21 VITALS — BP 142/88

## 2018-12-21 DIAGNOSIS — N870 Mild cervical dysplasia: Secondary | ICD-10-CM | POA: Diagnosis not present

## 2018-12-21 DIAGNOSIS — R87810 Cervical high risk human papillomavirus (HPV) DNA test positive: Secondary | ICD-10-CM

## 2018-12-21 DIAGNOSIS — R87612 Low grade squamous intraepithelial lesion on cytologic smear of cervix (LGSIL): Secondary | ICD-10-CM

## 2018-12-21 NOTE — Progress Notes (Signed)
    Jill Price February 12, 1966 [id number]        53 y.o.  G1P1L1 Divorced.  Boyfriend   RP: LGSIL for Colposcopy  HPI: Pap 11/2018 LGSIL.  10/2017 LGSIL/HPV HR positive.  Colpo 11/2017 CIN 1.  HPV 16-18-45 negative.   OB History  Gravida Para Term Preterm AB Living  1 1       1   SAB TAB Ectopic Multiple Live Births               # Outcome Date GA Lbr Len/2nd Weight Sex Delivery Anes PTL Lv  1 Para             Past medical history,surgical history, problem list, medications, allergies, family history and social history were all reviewed and documented in the EPIC chart.   Directed ROS with pertinent positives and negatives documented in the history of present illness/assessment and plan.  Exam:  Vitals:   12/21/18 0956  BP: (!) 142/88   General appearance:  Normal  Colposcopy Procedure Note [name] 12/21/2018  Indications:  LGSIL  Procedure Details  The risks and benefits of the procedure and Verbal informed consent obtained.  Speculum placed in vagina and excellent visualization of cervix achieved, cervix swabbed x 3 with acetic acid solution.  Findings:  Cervix colposcopy: Physical Exam Genitourinary:       Vaginal colposcopy: Normal  Vulvar colposcopy: Normal  Perirectal colposcopy:  Grossly normal  The cervix was sprayed with Hurricane before performing the cervical biopsies.  Specimens: HPV 16-18-45.  Cervical Bx at 3 O'Clock.  Complications:  None, hemostasis with Silver Nitrate . Plan:  Management per results   Assessment/Plan:  53 y.o. G1P1   1. LGSIL on Pap smear of cervix LGSIL on Pap test August 2020.  History of LGSIL with high-risk HPV +July 2019.  Colposcopy August 2019 showed CIN-1.  HPV 16-18-45 were negative in 2019.  Colposcopy procedure reviewed.  Colposcopy findings discussed with patient.  Pending HPV 16-18-45 and cervical biopsy.  Management per results.  Recommend folic acid supplements.  2. Cervical high  risk HPV (human papillomavirus) test positive HPV HR positive 10/2017.  HPV 16-18-45 negative 2019.  HPV 16-18-45 repeated today.  3. Dysplasia of cervix, low grade (CIN 1) CIN 1 Colpo 11/2017.  Other orders - Pathology Report ([facility]) - HPV Type 16 and 18/45 RNA  [name] MD, 10:12 AM 12/21/2018

## 2018-12-21 NOTE — Patient Instructions (Signed)
1. LGSIL on Pap smear of cervix LGSIL on Pap test August 2020.  History of LGSIL with high-risk HPV +July 2019.  Colposcopy August 2019 showed CIN-1.  HPV 16-18-45 were negative in 2019.  Colposcopy procedure reviewed.  Colposcopy findings discussed with patient.  Pending HPV 16-18-45 and cervical biopsy.  Management per results.  Recommend folic acid supplements.  2. Cervical high risk HPV (human papillomavirus) test positive HPV HR positive 10/2017.  HPV 16-18-45 negative 2019.  HPV 16-18-45 repeated today.  3. Dysplasia of cervix, low grade (CIN 1) CIN 1 Colpo 11/2017.  Other orders - Pathology Report ([facility]) - HPV Type 16 and 18/45 RNA  Jill Price, it was a pleasure seeing you today!  I will inform you of your results as soon as they are available.

## 2018-12-24 LAB — HPV TYPE 16 AND 18/45 RNA
HPV Type 16 RNA: NOT DETECTED
HPV Type 18/45 RNA: NOT DETECTED

## 2018-12-25 LAB — TISSUE SPECIMEN

## 2018-12-25 LAB — PATHOLOGY REPORT

## 2019-01-15 ENCOUNTER — Ambulatory Visit: Payer: 59 | Admitting: Obstetrics & Gynecology

## 2019-01-18 ENCOUNTER — Encounter: Payer: Self-pay | Admitting: Family Medicine

## 2019-01-21 NOTE — Telephone Encounter (Signed)
Does pt need labs first before sending in Vit D refill?

## 2019-01-24 ENCOUNTER — Encounter: Payer: Self-pay | Admitting: Family Medicine

## 2019-01-24 MED ORDER — VITAMIN D (ERGOCALCIFEROL) 1.25 MG (50000 UNIT) PO CAPS: ORAL_CAPSULE | ORAL | 0 refills | Status: DC

## 2019-01-24 NOTE — Telephone Encounter (Signed)
Rx sent in and pt aware 

## 2019-02-21 ENCOUNTER — Encounter: Payer: Self-pay | Admitting: Family Medicine

## 2019-02-22 ENCOUNTER — Encounter: Payer: Self-pay | Admitting: Family Medicine

## 2019-03-04 ENCOUNTER — Telehealth: Payer: Self-pay

## 2019-03-04 NOTE — Telephone Encounter (Signed)
I called pt and informed her that her medication has arrived.  Prempro 0.3MG /1.5MG .

## 2019-03-19 MED ORDER — VALACYCLOVIR HCL 1 G PO TABS: ORAL_TABLET | ORAL | 3 refills | Status: DC

## 2019-05-06 ENCOUNTER — Encounter: Payer: Self-pay | Admitting: Family Medicine

## 2019-05-15 ENCOUNTER — Encounter: Payer: Self-pay | Admitting: Family Medicine

## 2019-06-19 ENCOUNTER — Other Ambulatory Visit: Payer: Self-pay

## 2019-06-20 ENCOUNTER — Ambulatory Visit (INDEPENDENT_AMBULATORY_CARE_PROVIDER_SITE_OTHER): Payer: Managed Care, Other (non HMO) | Admitting: Obstetrics & Gynecology

## 2019-06-20 ENCOUNTER — Encounter: Payer: Self-pay | Admitting: Obstetrics & Gynecology

## 2019-06-20 VITALS — BP 130/80

## 2019-06-20 DIAGNOSIS — N870 Mild cervical dysplasia: Secondary | ICD-10-CM | POA: Diagnosis not present

## 2019-06-20 DIAGNOSIS — R87810 Cervical high risk human papillomavirus (HPV) DNA test positive: Secondary | ICD-10-CM

## 2019-06-20 NOTE — Patient Instructions (Signed)
1. Dysplasia of cervix, low grade (CIN 1) CIN-1 on colposcopy September 2020.  HPV 16-18-45 were negative.  Repeat Pap test with high-risk HPV done today.  2. Cervical high risk HPV (human papillomavirus) test positive High risk HPV positive, but HPV 16-18--18-45 were negative.  Lanier, it was a pleasure seeing you today!  I will inform you of your results as soon as they are available.

## 2019-06-20 NOTE — Progress Notes (Signed)
    Jc Yarwood 1966/02/08 [id number]        54 y.o.  G1P1L1 Divorced  RP: Repeat Pap test at 6 months  HPI: CIN 1 on Colpo 12/2018, HPV 16-18-45 Negative.  Well on Prempro.  No PMB.     OB History  Gravida Para Term Preterm AB Living  1 1       1   SAB TAB Ectopic Multiple Live Births               # Outcome Date GA Lbr Len/2nd Weight Sex Delivery Anes PTL Lv  1 Para             Past medical history,surgical history, problem list, medications, allergies, family history and social history were all reviewed and documented in the EPIC chart.   Directed ROS with pertinent positives and negatives documented in the history of present illness/assessment and plan.  Exam:  Vitals:   06/20/19 1540  BP: 130/80   General appearance:  Normal   Gynecologic exam: Vulva normal.  Speculum exam: Cervix and vagina normal.  Normal vaginal secretions.  Pap test with high-risk HPV done.   Assessment/Plan:  54 y.o. G1P1   1. Dysplasia of cervix, low grade (CIN 1) CIN-1 on colposcopy September 2020.  HPV 16-18-45 were negative.  Repeat Pap test with high-risk HPV done today.  2. Cervical high risk HPV (human papillomavirus) test positive High risk HPV positive, but HPV 16-18--18-45 were negative.  [name] MD, 3:48 PM 06/20/2019

## 2019-06-24 LAB — PAP, TP IMAGING W/ HPV RNA, RFLX HPV TYPE 16,18/45: HPV DNA High Risk: NOT DETECTED

## 2019-08-09 ENCOUNTER — Encounter: Payer: Self-pay | Admitting: Family Medicine

## 2019-08-09 ENCOUNTER — Other Ambulatory Visit: Payer: Self-pay

## 2019-08-09 MED ORDER — VITAMIN D (ERGOCALCIFEROL) 1.25 MG (50000 UNIT) PO CAPS: ORAL_CAPSULE | ORAL | 0 refills | Status: DC

## 2019-08-29 NOTE — Progress Notes (Signed)
Phone: [phone]   Subjective:  Patient presents today to establish care.  Prior patient of Alphonsa Overall.  Chief Complaint  Patient presents with  . TOC    Phone: [phone]   Subjective:  Patient presents today for their annual physical. Chief complaint-noted.   See problem oriented charting- Review of Systems  Constitutional: Negative for chills and fever.  HENT: Positive for tinnitus (occasional and mild). Negative for ear pain and hearing loss.   Eyes: Negative for blurred vision and double vision.  Respiratory: Negative for cough and wheezing.   Cardiovascular: Negative for chest pain, palpitations and leg swelling.  Gastrointestinal: Negative for abdominal pain, heartburn, nausea and vomiting.  Genitourinary: Negative for dysuria, frequency and urgency.  Musculoskeletal: Negative for back pain and falls.  Skin: Negative for itching and rash.  Neurological: Positive for headaches. Negative for dizziness.  Endo/Heme/Allergies: Negative for polydipsia. Does not bruise/bleed easily.  Psychiatric/Behavioral: Negative for depression, hallucinations, substance abuse and suicidal ideas.  -  The following were reviewed and entered/updated in epic: Past Medical History:  Diagnosis Date  . Depression    situational- never on meds   . Frequent headaches   . Migraines   . Onychomycosis 05/30/2018   Patient Active Problem List   Diagnosis Date Noted  . Hormone replacement therapy (HRT) 09/03/2018    Priority: Medium  . Vitamin D deficiency 08/03/2017    Priority: Medium  . Menopausal symptoms 07/13/2017    Priority: Medium  . Weight gain 02/12/2018    Priority: Low  . Bilateral hand pain 07/13/2017    Priority: Low   Past Surgical History:  Procedure Laterality Date  . none      Family History  Problem Relation Age of Onset  . Other Mother        age 74 in 2021. she gets med history on half siblings through mom  . Heart disease Father   . Dementia Father     . Diabetes Father   . Gout Father   . Other Half-Brother        drunk driver  . Healthy Half-Brother   . Healthy Half-Brother   . Lymphoma Half-Sister   . Healthy Half-Sister   . Healthy Half-Sister   . Breast cancer Neg Hx     Medications- reviewed and updated Current Outpatient Medications  Medication Sig Dispense Refill  . PREMPRO 0.3-1.5 MG tablet Take 1 tablet by mouth daily. 90 tablet 4  . valACYclovir (VALTREX) 1000 MG tablet Take one table by mouth daily 3-5 days for outbreak, then daily as needed. 90 tablet 3  . Vitamin D, Ergocalciferol, (DRISDOL) 1.25 MG (50000 UNIT) CAPS capsule TAKE ONE CAPSULE BY MOUTH EVERY 7 DAYS 4 capsule 0   No current facility-administered medications for this visit.    Allergies-reviewed and updated No Known Allergies  Social History   Social History Narrative   Single. Lives alone. No pets in 2021.       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    Objective  Objective:  BP 122/80   Pulse 68   Temp 98.2 F (36.8 C)   Ht 5\' 5"  (1.651 m)   Wt 142 lb (64.4 kg)   LMP 10/01/2016   SpO2 98%   BMI 23.63 kg/m  Gen: NAD, resting comfortably HEENT: Mucous membranes are moist. Oropharynx normal Neck: no thyromegaly CV:  RRR no murmurs rubs or gallops Lungs: CTAB no crackles, wheeze, rhonchi Abdomen: soft/nontender/nondistended/normal bowel sounds. No rebound or guarding.  Ext: no edema Skin: warm, dry Neuro: grossly normal, moves all extremities, PERRLA   Assessment and Plan   54 y.o. female presenting for annual physical.  Health Maintenance counseling: 1. Anticipatory guidance: Patient counseled regarding regular dental exams -q6 months, eye exams - yearly,  avoiding smoking and second hand smoke , limiting alcohol to 1 beverage per day- none .   2. Risk factor reduction:  Advised patient of need  for regular exercise and diet rich and fruits and vegetables to reduce risk of heart attack and stroke. Exercise- daily walker plus some jumping jacks and planks. Diet-reasonably healthy.  Wt Readings from Last 3 Encounters:  09/09/19 142 lb (64.4 kg)  11/28/18 141 lb (64 kg)  09/03/18 143 lb (64.9 kg)  3. Immunizations/screenings/ancillary studies- not interested in shingrix right now. Agrees to Tdap if gets cut/scrape but wants to hold off otherwords.  Immunization History  Administered Date(s) Administered  . PFIZER SARS-COV-2 Vaccination 06/14/2019, 07/05/2019   Health Maintenance Due  Topic Date Due  . Hepatitis C Screening -considering donating blood Never done  . HIV Screening - considering donating blood Never done   4. Cervical cancer screening- up to date with Dr. Dellis Filbert 5. Breast cancer screening-  breast exam with Dr. Dellis Filbert and mammogram  12/04/2018 6. Colon cancer screening - cologuard 12/2017 with 3 year repeat 7. Skin cancer screening- Dr. Martin [facility] at [facility] dermatology relocation  From [place]- has granuloma annulare. advised regular sunscreen use. Denies worrisome, changing, or new skin lesions.  8. Birth control/STD check-  monogamous with current partner. Has been tested with GYN.  9. Osteoporosis screening at 30- consider at age 53 -Never smoker  Status of chronic or acute concerns   #Vitamin D deficiency- had been low as 6 S: Medication: vitamin D 50k units weekly Last vitamin D Lab Results  Component Value Date   VD25OH 20 08/29/2018  A/P: low levels in past- doing well recently- update with bloodwork.   - may have to adjust dose  # Hormone replacement therapy- started with Dr. Tanja Port when menopausal and having a lot of symptoms in addition to low vitamin D. Foggy brain, skin cleared, started to lose weight, sleeping drastically improved, hot flashes improved, improved emotional stability- signifiant improvement and plan has been to continue low dose.    #migraines- triggered by light- situational and sunglasses help  #hyperlipidemia S: Medication:none  Lab Results  Component Value Date   CHOL 194 05/06/2016   HDL 79.80 05/06/2016   LDLCALC 102 (H) 05/06/2016   TRIG 61.0 05/06/2016   CHOLHDL 2 05/06/2016   A/P: Mild elevation in LDL in 2018-we will update labs today.  #HCV and HIV testing- low risk but she will consider donating blood which would formally screen her  Recommended follow up:  1 year physical  Future Appointments  Date Time Provider [place]  12/02/2019  8:00 AM [name], MD [facility]   Lab/Order associations: not fasting- will schedule next week   ICD-10-CM   1. Vitamin D deficiency  E55.9 VITAMIN D 25 Hydroxy (Vit-D Deficiency, Fractures)  2. Bilateral hand pain  M79.641    M79.642   3. Preventative health care  Z00.00 VITAMIN D 25 Hydroxy (Vit-D Deficiency, Fractures)    CBC with Differential/Platelet    Comprehensive metabolic panel    Lipid panel  4. Hyperlipidemia, unspecified hyperlipidemia type  E78.5 CBC  with Differential/Platelet    Comprehensive metabolic panel    Lipid panel    No orders of the defined types were placed in this encounter.   Return precautions advised.  Garret Reddish, MD

## 2019-08-29 NOTE — Patient Instructions (Addendum)
Health Maintenance Due  Topic Date Due  . Hepatitis C Screening - #HCV and HIV testing- low risk but she will consider donating blood which would formally screen her Never done  . HIV Screening - #HCV and HIV testing- low risk but she will consider donating blood which would formally screen her Never done  . TETANUS/TDAP - due for this if cut/scrape Never done   Schedule a lab visit at the check out desk within 2 weeks. Return for future fasting labs meaning nothing but water after midnight please. Ok to take your medications with water.

## 2019-09-03 ENCOUNTER — Other Ambulatory Visit: Payer: Self-pay | Admitting: Family Medicine

## 2019-09-09 ENCOUNTER — Other Ambulatory Visit: Payer: Self-pay

## 2019-09-09 ENCOUNTER — Encounter: Payer: Self-pay | Admitting: Family Medicine

## 2019-09-09 ENCOUNTER — Ambulatory Visit (INDEPENDENT_AMBULATORY_CARE_PROVIDER_SITE_OTHER): Payer: Managed Care, Other (non HMO) | Admitting: Family Medicine

## 2019-09-09 VITALS — BP 122/80 | HR 68 | Temp 98.2°F | Ht 65.0 in | Wt 142.0 lb

## 2019-09-09 DIAGNOSIS — E785 Hyperlipidemia, unspecified: Secondary | ICD-10-CM

## 2019-09-09 DIAGNOSIS — M79641 Pain in right hand: Secondary | ICD-10-CM

## 2019-09-09 DIAGNOSIS — Z0000 Encounter for general adult medical examination without abnormal findings: Secondary | ICD-10-CM

## 2019-09-09 DIAGNOSIS — E559 Vitamin D deficiency, unspecified: Secondary | ICD-10-CM

## 2019-09-09 DIAGNOSIS — M79642 Pain in left hand: Secondary | ICD-10-CM

## 2019-09-26 ENCOUNTER — Other Ambulatory Visit: Payer: Managed Care, Other (non HMO)

## 2019-10-01 ENCOUNTER — Other Ambulatory Visit: Payer: Self-pay | Admitting: Family Medicine

## 2019-10-28 ENCOUNTER — Other Ambulatory Visit: Payer: Self-pay | Admitting: Family Medicine

## 2019-11-24 ENCOUNTER — Other Ambulatory Visit: Payer: Self-pay | Admitting: Family Medicine

## 2019-11-26 ENCOUNTER — Encounter: Payer: Self-pay | Admitting: Family Medicine

## 2019-11-27 ENCOUNTER — Other Ambulatory Visit: Payer: Self-pay

## 2019-11-27 MED ORDER — VITAMIN D (ERGOCALCIFEROL) 1.25 MG (50000 UNIT) PO CAPS: 50000.0000 [IU] | ORAL_CAPSULE | ORAL | 2 refills | Status: DC

## 2019-11-29 ENCOUNTER — Encounter: Payer: 59 | Admitting: Obstetrics & Gynecology

## 2019-12-02 ENCOUNTER — Encounter: Payer: Managed Care, Other (non HMO) | Admitting: Obstetrics & Gynecology

## 2019-12-13 ENCOUNTER — Ambulatory Visit (INDEPENDENT_AMBULATORY_CARE_PROVIDER_SITE_OTHER): Payer: Managed Care, Other (non HMO) | Admitting: Obstetrics & Gynecology

## 2019-12-13 ENCOUNTER — Other Ambulatory Visit: Payer: Self-pay

## 2019-12-13 ENCOUNTER — Encounter: Payer: Self-pay | Admitting: Obstetrics & Gynecology

## 2019-12-13 VITALS — BP 120/70 | Ht 65.0 in | Wt 140.0 lb

## 2019-12-13 DIAGNOSIS — Z01419 Encounter for gynecological examination (general) (routine) without abnormal findings: Secondary | ICD-10-CM

## 2019-12-13 DIAGNOSIS — N870 Mild cervical dysplasia: Secondary | ICD-10-CM | POA: Diagnosis not present

## 2019-12-13 DIAGNOSIS — Z79890 Hormone replacement therapy (postmenopausal): Secondary | ICD-10-CM | POA: Diagnosis not present

## 2019-12-13 DIAGNOSIS — R87810 Cervical high risk human papillomavirus (HPV) DNA test positive: Secondary | ICD-10-CM | POA: Diagnosis not present

## 2019-12-13 DIAGNOSIS — Z113 Encounter for screening for infections with a predominantly sexual mode of transmission: Secondary | ICD-10-CM

## 2019-12-13 DIAGNOSIS — Z1151 Encounter for screening for human papillomavirus (HPV): Secondary | ICD-10-CM

## 2019-12-13 MED ORDER — PREMPRO 0.3-1.5 MG PO TABS: 1.0000 | ORAL_TABLET | Freq: Every day | ORAL | 4 refills | Status: DC

## 2019-12-13 NOTE — Progress Notes (Signed)
Jill Price [birth year] [id number]   History:    54 y.o. G1P1L1 Divorced.  Son lives in [place] with his father.  RP:  Established patient presenting for annual gyn exam   HPI: Postmenopause, well on Prempro.  No PMB.  No pelvic pain.  Not sexually active since 07/2019.  Breasts normal.  Urine/BMs normal.  BMI 23.3.  Pilates. Walking. [facility].  Health labs with Fam MD.  Past medical history,surgical history, family history and social history were all reviewed and documented in the EPIC chart.  Gynecologic History Patient's last menstrual period was 10/01/2016.  Obstetric History OB History  Gravida Para Term Preterm AB Living  _0 SAB TAB Ectopic Multiple Live Births               # Outcome Date GA Lbr Len/2nd Weight Sex Delivery Anes PTL Lv  1 Para              ROS: A ROS was performed and pertinent positives and negatives are included in the history.  GENERAL: No fevers or chills. HEENT: No change in vision, no earache, sore throat or sinus congestion. NECK: No pain or stiffness. CARDIOVASCULAR: No chest pain or pressure. No palpitations. PULMONARY: No shortness of breath, cough or wheeze. GASTROINTESTINAL: No abdominal pain, nausea, vomiting or diarrhea, melena or bright red blood per rectum. GENITOURINARY: No urinary frequency, urgency, hesitancy or dysuria. MUSCULOSKELETAL: No joint or muscle pain, no back pain, no recent trauma. DERMATOLOGIC: No rash, no itching, no lesions. ENDOCRINE: No polyuria, polydipsia, no heat or cold intolerance. No recent change in weight. HEMATOLOGICAL: No anemia or easy bruising or bleeding. NEUROLOGIC: No headache, seizures, numbness, tingling or weakness. PSYCHIATRIC: No depression, no loss of interest in normal activity or change in sleep pattern.     Exam:   BP 120/70   Ht 5' 5" (1.651 m)   Wt 140 lb (63.5 kg)   LMP 10/01/2016   BMI 23.30 kg/m   Body mass index is 23.3 kg/m.  General appearance :  Well developed well nourished female. No acute distress HEENT: Eyes: no retinal hemorrhage or exudates,  Neck supple, trachea midline, no carotid bruits, no thyroidmegaly Lungs: Clear to auscultation, no rhonchi or wheezes, or rib retractions  Heart: Regular rate and rhythm, no murmurs or gallops Breast:Examined in sitting and supine position were symmetrical in appearance, no palpable masses or tenderness,  no skin retraction, no nipple inversion, no nipple discharge, no skin discoloration, no axillary or supraclavicular lymphadenopathy Abdomen: no palpable masses or tenderness, no rebound or guarding Extremities: no edema or skin discoloration or tenderness  Pelvic: Vulva: Normal             Vagina: No gross lesions or discharge  Cervix: No gross lesions or discharge.  Pap reflex, HPV HR.  Gono-Chlam done.  Uterus AV, normal size, shape and consistency, non-tender and mobile  Adnexa  Without masses or tenderness  Anus: Normal   Assessment/Plan:  54 y.o. female for annual exam   1. Encounter for routine gynecological examination with Papanicolaou smear of cervix Normal gynecologic exam.  Pap reflex done.  Breast exam normal.  Scheduled now for a screening mammogram.  Cologuard 2019.  Fasting health labs here today.  Good body mass index at 23.3.  Continue with fitness and healthy nutrition. - CBC - Comp Met (CMET) - TSH - Lipid panel - VITAMIN D 25 Hydroxy (Vit-D Deficiency, Fractures)  2.  Dysplasia of cervix, low grade (CIN 1) Pap reflex done today.   3. Cervical high risk HPV (human papillomavirus) test positive Pap reflex done today.  4. Postmenopausal hormone replacement therapy Postmenopausal well on hormone replacement therapy with Prempro.  No contraindication to continue.  Prempro represcribed.  Vitamin D supplements, calcium intake of 1200 to 1500 mg daily and regular weightbearing physical activities to continue.  Other orders - PREMPRO 0.3-1.5 MG tablet; Take 1 tablet  by mouth daily.  Marie-Lyne Lavoie MD, 8:25 AM 12/13/2019   

## 2019-12-14 LAB — COMPREHENSIVE METABOLIC PANEL
AG Ratio: 1.7 (calc) (ref 1.0–2.5)
ALT: 11 U/L (ref 6–29)
AST: 15 U/L (ref 10–35)
Albumin: 4.3 g/dL (ref 3.6–5.1)
Alkaline phosphatase (APISO): 79 U/L (ref 37–153)
BUN: 12 mg/dL (ref 7–25)
CO2: 25 mmol/L (ref 20–32)
Calcium: 9 mg/dL (ref 8.6–10.4)
Chloride: 106 mmol/L (ref 98–110)
Creat: 0.68 mg/dL (ref 0.50–1.05)
Globulin: 2.5 g/dL (calc) (ref 1.9–3.7)
Glucose, Bld: 84 mg/dL (ref 65–99)
Potassium: 4.3 mmol/L (ref 3.5–5.3)
Sodium: 140 mmol/L (ref 135–146)
Total Bilirubin: 0.8 mg/dL (ref 0.2–1.2)
Total Protein: 6.8 g/dL (ref 6.1–8.1)

## 2019-12-14 LAB — VITAMIN D 25 HYDROXY (VIT D DEFICIENCY, FRACTURES): Vit D, 25-Hydroxy: 67 ng/mL (ref 30–100)

## 2019-12-14 LAB — LIPID PANEL
Cholesterol: 190 mg/dL (ref <200)
HDL: 82 mg/dL (ref >=50)
LDL Cholesterol (Calc): 83 mg/dL (calc)
Non-HDL Cholesterol (Calc): 108 mg/dL (calc) (ref <130)
Total CHOL/HDL Ratio: 2.3 (calc) (ref <5.0)
Triglycerides: 145 mg/dL (ref <150)

## 2019-12-14 LAB — CBC
HCT: 40.3 % (ref 35.0–45.0)
Hemoglobin: 13.3 g/dL (ref 11.7–15.5)
MCH: 29.2 pg (ref 27.0–33.0)
MCHC: 33 g/dL (ref 32.0–36.0)
MCV: 88.6 fL (ref 80.0–100.0)
MPV: 12.5 fL (ref 7.5–12.5)
Platelets: 294 10*3/uL (ref 140–400)
RBC: 4.55 10*6/uL (ref 3.80–5.10)
RDW: 12.2 % (ref 11.0–15.0)
WBC: 6.2 10*3/uL (ref 3.8–10.8)

## 2019-12-14 LAB — TSH: TSH: 1.86 mIU/L

## 2019-12-19 LAB — PAP IG, CT-NG NAA, HPV HIGH-RISK
C. trachomatis RNA, TMA: NOT DETECTED
HPV DNA High Risk: NOT DETECTED
N. gonorrhoeae RNA, TMA: NOT DETECTED

## 2019-12-20 MED ORDER — FLUCONAZOLE 150 MG PO TABS: 150.0000 mg | ORAL_TABLET | Freq: Once | ORAL | 0 refills | Status: AC

## 2019-12-23 ENCOUNTER — Other Ambulatory Visit: Payer: Self-pay | Admitting: Obstetrics & Gynecology

## 2019-12-23 DIAGNOSIS — Z1231 Encounter for screening mammogram for malignant neoplasm of breast: Secondary | ICD-10-CM

## 2020-01-07 ENCOUNTER — Ambulatory Visit
Admission: RE | Admit: 2020-01-07 | Discharge: 2020-01-07 | Disposition: A | Discharge status: Home / Self Care | Payer: Managed Care, Other (non HMO) | Source: Ambulatory Visit | Attending: Obstetrics & Gynecology | Admitting: Obstetrics & Gynecology

## 2020-01-07 ENCOUNTER — Other Ambulatory Visit: Payer: Self-pay

## 2020-01-07 DIAGNOSIS — Z1231 Encounter for screening mammogram for malignant neoplasm of breast: Secondary | ICD-10-CM

## 2020-01-16 NOTE — Telephone Encounter (Signed)
E55.9 code sent to [facility] is aware she will receive a new EOB

## 2020-01-20 ENCOUNTER — Ambulatory Visit: Payer: Managed Care, Other (non HMO) | Admitting: Family Medicine

## 2020-02-05 ENCOUNTER — Ambulatory Visit
Admission: RE | Admit: 2020-02-05 | Discharge: 2020-02-05 | Disposition: A | Discharge status: Home / Self Care | Payer: Managed Care, Other (non HMO) | Source: Ambulatory Visit | Attending: Obstetrics & Gynecology | Admitting: Obstetrics & Gynecology

## 2020-02-05 ENCOUNTER — Other Ambulatory Visit: Payer: Self-pay

## 2020-02-10 ENCOUNTER — Ambulatory Visit (INDEPENDENT_AMBULATORY_CARE_PROVIDER_SITE_OTHER): Payer: Managed Care, Other (non HMO) | Admitting: Family Medicine

## 2020-02-10 ENCOUNTER — Other Ambulatory Visit: Payer: Self-pay

## 2020-02-10 ENCOUNTER — Encounter: Payer: Self-pay | Admitting: Family Medicine

## 2020-02-10 VITALS — BP 135/59 | Ht 64.0 in | Wt 141.0 lb

## 2020-02-10 DIAGNOSIS — M7632 Iliotibial band syndrome, left leg: Secondary | ICD-10-CM

## 2020-02-10 DIAGNOSIS — M25371 Other instability, right ankle: Secondary | ICD-10-CM | POA: Diagnosis not present

## 2020-02-10 DIAGNOSIS — M25562 Pain in left knee: Secondary | ICD-10-CM

## 2020-02-10 NOTE — Patient Instructions (Signed)
You have right ankle instability, left IT band syndrome with bursitis and left knee patellofemoral syndrome. Ice the area for 15 minutes at a time, 3-4 times a day as needed. Aleve 2 tabs twice a day with food OR ibuprofen 3 tabs three times a day with food for pain and inflammation only if needed. Elevate above the level of your heart when possible Ankle brace may be helpful if you're doing long walks on an irregular surface. Start physical therapy for all three issues and do home exercises on days you don't go to therapy. Follow up in 6 weeks.

## 2020-02-11 ENCOUNTER — Encounter: Payer: Self-pay | Admitting: Family Medicine

## 2020-02-11 NOTE — Progress Notes (Signed)
PCP: Marin Olp, MD  Subjective:   HPI: Patient is a 54 y.o. female here for right ankle, left knee, left hip pain.  Patient reports she's inverted her right ankle about 3 times in the past 4 years. Has continued to have swelling of anterior right ankle with some pain. Believes due to compensation she's developed anterior left knee and lateral left hip pain. Knee feels like it gives way at times but no twisting injury and no swelling. Lateral left hip pain is worse when lying on left side and has been present for about 2 months. She's interested in doing physical therapy. No numbness or tingling.  Past Medical History:  Diagnosis Date  . Depression    situational- never on meds   . Frequent headaches   . Migraines   . Onychomycosis 05/30/2018    Current Outpatient Medications on File Prior to Visit  Medication Sig Dispense Refill  . PREMPRO 0.3-1.5 MG tablet Take 1 tablet by mouth daily. 90 tablet 4  . valACYclovir (VALTREX) 1000 MG tablet Take one table by mouth daily 3-5 days for outbreak, then daily as needed. 90 tablet 3  . Vitamin D, Ergocalciferol, (DRISDOL) 1.25 MG (50000 UNIT) CAPS capsule Take 1 capsule (50,000 Units total) by mouth once a week. 4 capsule 2   No current facility-administered medications on file prior to visit.    Past Surgical History:  Procedure Laterality Date  . none      No Known Allergies  Social History   Socioeconomic History  . Marital status: Single    Spouse name: Not on file  . Number of children: Not on file  . Years of education: Not on file  . Highest education level: Not on file  Occupational History  . Not on file  Tobacco Use  . Smoking status: Never Smoker  . Smokeless tobacco: Never Used  Vaping Use  . Vaping Use: Never used  Substance and Sexual Activity  . Alcohol use: No  . Drug use: No  . Sexual activity: Not Currently    Birth control/protection: None    Comment: 1st intercourse- 46, partners- 4,    Other Topics Concern  . Not on file  Social History Narrative   Single. Lives alone. No pets in 2021.       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    Social Determinants of Health   Financial Resource Strain:   . Difficulty of Paying Living Expenses: Not on file  Food Insecurity:   . Worried About Charity fundraiser in the Last Year: Not on file  . Ran Out of Food in the Last Year: Not on file  Transportation Needs:   . Lack of Transportation (Medical): Not on file  . Lack of Transportation (Non-Medical): Not on file  Physical Activity:   . Days of Exercise per Week: Not on file  . Minutes of Exercise per Session: Not on file  Stress:   . Feeling of Stress : Not on file  Social Connections:   . Frequency of Communication with Friends and Family: Not on file  . Frequency of Social Gatherings with Friends and Family: Not on file  . Attends Religious Services: Not on file  . Active Member of Clubs or Organizations: Not on file  . Attends Archivist Meetings: Not on file  .  Marital Status: Not on file  Intimate Partner Violence:   . Fear of Current or Ex-Partner: Not on file  . Emotionally Abused: Not on file  . Physically Abused: Not on file  . Sexually Abused: Not on file    Family History  Problem Relation Age of Onset  . Other Mother        age 30 in 2021. she gets med history on half siblings through mom  . Heart disease Father   . Dementia Father   . Diabetes Father   . Gout Father   . Other Half-Brother        drunk driver  . Healthy Half-Brother   . Healthy Half-Brother   . Lymphoma Half-Sister   . Healthy Half-Sister   . Healthy Half-Sister   . Breast cancer Neg Hx     BP (!) 135/59   Ht 5\' 4"  (1.626 m)   Wt 141 lb (64 kg)   LMP 10/01/2016   BMI 24.20 kg/m   [facility] Adult [facility]  02/10/2020  Frequency of aerobic exercise (# of days/week) 4  Average time in minutes 45  Frequency of strengthening activities (# of days/week) 3    No flowsheet data found.  Review of Systems: See HPI above.     Objective:  Physical Exam:  Gen: NAD, comfortable in exam room  Right ankle: Mild anterolateral swelling compared to left.  No other gross deformity, ecchymoses FROM with 5/5 strength TTP minimally over ATFL, anterior ankle. 2+ ant drawer and trace talar tilt.   Negative syndesmotic compression. Thompsons test negative. NV intact distally.  Left knee: No gross deformity, ecchymoses, swelling.  VMO atrophy. No TTP. FROM. Negative ant/post drawers. Negative valgus/varus testing. Negative lachmans. Negative mcmurrays, apleys, patellar apprehension. NV intact distally.  Left hip: No deformity. FROM with 5/5 strength including hip abduction. Tenderness to palpation throughout IT band, worst at greater trochanter. NVI distally. Positive ober's, negative noble. Negative logroll, faber, fadir.   Assessment & Plan:  1. Right ankle pain - 2/2 instability from recurrent sprains.  Icing, aleve or ibuprofen only if needed.  Start physical therapy for strengthening, proprioception.  Consider ankle brace.  F/u in 6 weeks.  2. Left hip pain - 2/2 IT band syndrome with greater trochanteric pain syndrome.  Stretches, strengthening in physical therapy.  Aleve or ibuprofen if needed.  Icing.  3. Left knee pain - exam is reassuring.  Most consistent with patellofemoral pain syndrome with reflex causing buckling.  Start physical therapy for this as well.

## 2020-02-16 ENCOUNTER — Other Ambulatory Visit: Payer: Self-pay | Admitting: Family Medicine

## 2020-03-02 ENCOUNTER — Ambulatory Visit: Payer: Managed Care, Other (non HMO) | Attending: Family Medicine | Admitting: Physical Therapy

## 2020-03-02 ENCOUNTER — Encounter: Payer: Self-pay | Admitting: Physical Therapy

## 2020-03-02 ENCOUNTER — Other Ambulatory Visit: Payer: Self-pay

## 2020-03-02 DIAGNOSIS — R29898 Other symptoms and signs involving the musculoskeletal system: Secondary | ICD-10-CM

## 2020-03-02 DIAGNOSIS — M25552 Pain in left hip: Secondary | ICD-10-CM | POA: Diagnosis present

## 2020-03-02 DIAGNOSIS — M6281 Muscle weakness (generalized): Secondary | ICD-10-CM | POA: Diagnosis present

## 2020-03-02 NOTE — Patient Instructions (Signed)
Access Code: [email or web address]: [email or web address]: 11/29/2021Prepared by: Anderson Malta PaaExercises  Single Leg Stance with Arms Out - 1 x daily - 7 x weekly - 1 sets - 5 reps - 30 hold  Supine ITB Stretch with Strap - 1 x daily - 7 x weekly - 1 sets - 3 reps - 30 hold  Straight Leg Raise with External Rotation - 1 x daily - 7 x weekly - 3 sets - 10 reps - 5 hold  Sidelying Hip Abduction - 1 x daily - 7 x weekly - 3 sets - 10 reps - 5 hold  Sidelying Hip Adduction - 1 x daily - 7 x weekly - 3 sets - 10 reps - 5 hold  Sidelying Feet Elevated Clamshells - 1 x daily - 7 x weekly - 3 sets - 10 reps - 5 hold

## 2020-03-03 NOTE — Therapy (Signed)
[place], [place], [address] Phone: [phone]   Fax:  [phone]  Physical Therapy Evaluation  Patient Details  Name: Jill Price MRN: [id number] Date of Birth: 1966/03/05 Referring Provider (PT): Dr. Karlton Lemon    Encounter Date: 03/02/2020   PT End of Session - 03/03/20 1422    Visit Number 1    Number of Visits 8    Date for PT Re-Evaluation 04/27/20    PT Start Time 7824    PT Stop Time 1705    PT Time Calculation (min) 50 min    Activity Tolerance Patient tolerated treatment well    Behavior During Therapy [facility] for tasks assessed/performed           Past Medical History:  Diagnosis Date  . Depression    situational- never on meds   . Frequent headaches   . Migraines   . Onychomycosis 05/30/2018    Past Surgical History:  Procedure Laterality Date  . none      There were no vitals filed for this visit.    Subjective Assessment - 03/02/20 1622    Subjective This patient has chronic Rt ankle instability from multiple sprains.  She developed L hip pain over the past year.  Her hip pain wakes her from sleep.  She sometimes feels insecure about her L knee, may given out with steps and walking. She has been doing Pilates 3 x per week in group classes and feels stronger in general but would like to prevent further pain and remain active.    Pertinent History depression, migraines    Limitations Other (comment);Walking;House hold activities;Lifting    Patient Stated Goals I want to strengthen my ankle and avoid falling    Currently in Pain? Yes    Pain Score 5     Pain Location Hip    Pain Orientation Left;Lateral    Pain Descriptors / Indicators Aching;Sore    Pain Type Chronic pain    Pain Onset More than a month ago    Pain Frequency Intermittent    Aggravating Factors  lying on it, pressure    Pain Relieving Factors ice, meds , changing positions    Multiple Pain Sites  Yes    Pain Score --   none in ankle or knee currently             [facility] PT Assessment - 03/03/20 0001      Assessment   Medical Diagnosis L knee pain, L ITB syndrome , Rt ankle instability     Referring Provider (PT) Dr. Karlton Lemon     Prior Therapy No       Precautions   Precautions None      Restrictions   Weight Bearing Restrictions No      Balance Screen   Has the patient fallen in the past 6 months Yes    How many times? 1    Has the patient had a decrease in activity level because of a fear of falling?  No    Is the patient reluctant to leave their home because of a fear of falling?  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     Leisure travel, friends, Pilates       Cognition   Overall Cognitive Status Within Functional Limits for tasks assessed      Observation/Other Assessments   Focus on Therapeutic Outcomes (FOTO)  97% ability      Sensation   Light Touch Appears Intact      Squat   Comments cues needed for alignment       Single Leg Stance   Comments WFL but increased effort on Rt LE leans into extension, mild pelvic instabilty LLE       Posture/Postural Control   Posture Comments high L shoulder ,Rt trunk compressed       AROM   Lumbar Flexion WFL    Lumbar Extension WFL    Lumbar - Right Rotation WFL     Lumbar - Left Rotation WFL       Strength   Right Hip Flexion 5/5    Right Hip Extension 5/5    Right Hip ABduction 5/5    Left Hip Flexion 5/5    Left Hip Extension 4/5    Left Hip ABduction 4/5    Right Knee Flexion 5/5    Right Knee Extension 5/5     Left Knee Flexion 5/5    Left Knee Extension 5/5      Palpation   Palpation comment patella sits facing inward bilateral             Objective measurements completed on examination: See above findings.       PT Education - 03/03/20 1422    Education Details PT/POC,eval findings, HEP    Person(s) Educated Patient    Methods Explanation;Demonstration;Handout    Comprehension Verbalized understanding;Returned demonstration            PT Short Term Goals - 03/03/20 1424      PT SHORT TERM GOAL #1   Title Pt will be I with HEP for hip strength, ankle stability    Time 4    Period Weeks    Status New    Target Date 03/30/20      PT SHORT TERM GOAL #2   Title Pt will be able to maintain min dynamic balance on Rt ankle without loss of balance or pain    Time 4    Period Weeks    Status New    Target Date 03/30/20      PT SHORT TERM GOAL #3   Title Pt will report less instances of pain in L hip when sleeping ,lying on L side during Pilates.    Time 4    Period Weeks    Status New    Target Date 03/30/20             PT Long Term Goals - 03/03/20 1423      PT LONG TERM GOAL #1   Title Pt will be I with HEP for LE strength and balance, proprioception    Time 8    Period Weeks    Status New    Target Date 04/27/20      PT LONG TERM GOAL #2   Title Pt will be able to have improved confidence with steps in her home and in the community    Time 8  Period Weeks    Status New    Target Date 04/27/20      PT LONG TERM GOAL #3   Title Pt will able to demo hip ankle and knee strength 5/5 in all planes    Time 8    Period Weeks    Status New    Target Date 04/27/20                  Plan - 03/03/20 1436    Clinical Impression Statement Patient with low complexity eval of of chronic L hip pain in the presence of Rt sided chronic ankle instability. She has likely developed  a compensation for Rt ankle discomfort and lack of proprioception over the  years. She has mild L VMO weakness, weak hip extension and abduction on L side.  She should do quite well with PT intervention along with her current exercise routine.    Personal Factors and Comorbidities Time since onset of injury/illness/exacerbation    Examination-Activity Limitations Bend;Lift;Squat;Locomotion Level;Stairs;Stand    Examination-Participation Restrictions Interpersonal Relationship;Yard Work;Community Activity    Stability/Clinical Decision Making Stable/Uncomplicated    Clinical Decision Making Low    Rehab Potential Excellent    PT Frequency 1x / week    PT Duration 8 weeks    PT Treatment/Interventions ADLs/Self Care Home Management;Cryotherapy;Taping;Manual techniques;Passive range of motion;Dry needling;Patient/family education;Therapeutic exercise;Ultrasound;Functional mobility training;Neuromuscular re-education;Therapeutic activities;Moist Heat;Iontophoresis 4mg /ml Dexamethasone;Electrical Stimulation;Balance training    PT Next Visit Plan check HEP, manual/modalities to L hip, SLS    PT Home Exercise Plan [email or web address]    Consulted and Agree with Plan of Care Patient           Patient will benefit from skilled therapeutic intervention in order to improve the following deficits and impairments:  Decreased activity tolerance, Decreased strength, Increased fascial restricitons, Impaired flexibility, Pain, Decreased mobility, Decreased balance, Increased edema, Difficulty walking  Visit Diagnosis: Pain in left hip  Other symptoms and signs involving the musculoskeletal system  Muscle weakness (generalized)     Problem List Patient Active Problem List   Diagnosis Date Noted  . Hormone replacement therapy (HRT) 09/03/2018  . Weight gain 02/12/2018  . Vitamin D deficiency 08/03/2017  . Bilateral hand pain 07/13/2017  . Menopausal symptoms 07/13/2017    Audy Dauphine 03/03/2020, 2:47 PM  [facility] [address] [place], [place], [address] Phone: [phone]   Fax:  [phone]  Name: Amiliah Campisi MRN: [id number] Date of Birth: 1965/04/11  Raeford Razor, PT 03/03/20 2:47 PM Phone: [phone] Fax: [phone]

## 2020-03-09 ENCOUNTER — Ambulatory Visit: Payer: Managed Care, Other (non HMO) | Admitting: Physical Therapy

## 2020-03-19 ENCOUNTER — Other Ambulatory Visit: Payer: Self-pay

## 2020-03-19 ENCOUNTER — Ambulatory Visit: Payer: Managed Care, Other (non HMO) | Attending: Family Medicine | Admitting: Physical Therapy

## 2020-03-19 ENCOUNTER — Encounter: Payer: Self-pay | Admitting: Physical Therapy

## 2020-03-19 DIAGNOSIS — M25552 Pain in left hip: Secondary | ICD-10-CM | POA: Diagnosis present

## 2020-03-19 DIAGNOSIS — M6281 Muscle weakness (generalized): Secondary | ICD-10-CM

## 2020-03-19 DIAGNOSIS — R29898 Other symptoms and signs involving the musculoskeletal system: Secondary | ICD-10-CM | POA: Diagnosis present

## 2020-03-19 NOTE — Therapy (Signed)
[place], [place], [address] Phone: [phone]   Fax:  [phone]  Physical Therapy Treatment  Patient Details  Name: Erika Slaby MRN: [id number] Date of Birth: 03/16/1966 Referring Provider (PT): Dr. Karlton Lemon    Encounter Date: 03/19/2020   PT End of Session - 03/19/20 1053    Visit Number 2    Number of Visits 8    Date for PT Re-Evaluation 04/27/20    PT Start Time 1045    PT Stop Time 1135    PT Time Calculation (min) 50 min    Activity Tolerance Patient tolerated treatment well    Behavior During Therapy [facility] for tasks assessed/performed           Past Medical History:  Diagnosis Date  . Depression    situational- never on meds   . Frequent headaches   . Migraines   . Onychomycosis 05/30/2018    Past Surgical History:  Procedure Laterality Date  . none      There were no vitals filed for this visit.   Subjective Assessment - 03/19/20 1052    Subjective Have been doing well. No pain today, has been "pretty good" about doing her exercises.    Currently in Pain? No/denies              [facility] Adult PT Treatment/Exercise - 03/19/20 0001      Knee/Hip Exercises: Stretches   Active Hamstring Stretch 2 reps;30 seconds    ITB Stretch 2 reps;30 seconds    Other Knee/Hip Stretches adduction (3 way stretch)      Knee/Hip Exercises: Aerobic   Stationary Bike 5 min, L2 warm up      Knee/Hip Exercises: Standing   Other Standing Knee Exercises Single leg balance, high march , hip hinge, semi circles using foam pad    Other Standing Knee Exercises back lunge to single leg balance   x 5 each side     Knee/Hip Exercises: Supine   Hip Adduction Isometric Limitations x 10    Bridges Limitations alternating knee ext in mini bridge with ball squeeze    Bridges with [employer] 2 sets;15 reps    Straight Leg Raise with External Rotation Both;2 sets;15 reps      Knee/Hip  Exercises: Sidelying   Hip ABduction Strengthening;2 sets;15 reps    Hip ADduction Strengthening;Both;2 sets;15 reps    Clams x 20                    PT Short Term Goals - 03/03/20 1424      PT SHORT TERM GOAL #1   Title Pt will be I with HEP for hip strength, ankle stability    Time 4    Period Weeks    Status New    Target Date 03/30/20      PT SHORT TERM GOAL #2   Title Pt will be able to maintain min dynamic balance on Rt ankle without loss of balance or pain    Time 4    Period Weeks    Status New    Target Date 03/30/20      PT SHORT TERM GOAL #3   Title Pt will report less instances of pain in L hip when sleeping ,lying on L side during Pilates.    Time 4    Period Weeks    Status New    Target Date 03/30/20  PT Long Term Goals - 03/03/20 1423      PT LONG TERM GOAL #1   Title Pt will be I with HEP for LE strength and balance, proprioception    Time 8    Period Weeks    Status New    Target Date 04/27/20      PT LONG TERM GOAL #2   Title Pt will be able to have improved confidence with steps in her home and in the community    Time 8    Period Weeks    Status New    Target Date 04/27/20      PT LONG TERM GOAL #3   Title Pt will able to demo hip ankle and knee strength 5/5 in all planes    Time 8    Period Weeks    Status New    Target Date 04/27/20                 Plan - 03/19/20 1054    Clinical Impression Statement Patient doing well overall, cont to benefit from skilled PT intervention for hip and knee strength, stability.  She had a good sense of symmetry of strength and alignment. Cont POC.    PT Treatment/Interventions ADLs/Self Care Home Management;Cryotherapy;Taping;Manual techniques;Passive range of motion;Dry needling;Patient/family education;Therapeutic exercise;Ultrasound;Functional mobility training;Neuromuscular re-education;Therapeutic activities;Moist Heat;Iontophoresis 4mg /ml Dexamethasone;Electrical  Stimulation;Balance training    PT Next Visit Plan check HEP, include piriformis stretching, manual/modalities to L hip, SLS    PT Home Exercise Plan [email or web address]    Consulted and Agree with Plan of Care Patient           Patient will benefit from skilled therapeutic intervention in order to improve the following deficits and impairments:  Decreased activity tolerance,Decreased strength,Increased fascial restricitons,Impaired flexibility,Pain,Decreased mobility,Decreased balance,Increased edema,Difficulty walking  Visit Diagnosis: Pain in left hip  Other symptoms and signs involving the musculoskeletal system  Muscle weakness (generalized)     Problem List Patient Active Problem List   Diagnosis Date Noted  . Hormone replacement therapy (HRT) 09/03/2018  . Weight gain 02/12/2018  . Vitamin D deficiency 08/03/2017  . Bilateral hand pain 07/13/2017  . Menopausal symptoms 07/13/2017    Ellwyn Ergle 03/19/2020, 12:51 PM  [facility] [address] [place], [place], [address] Phone: [phone]   Fax:  [phone]  Name: Angelamarie Avakian MRN: [id number] Date of Birth: October 28, 1965  Raeford Razor, PT 03/19/20 12:51 PM Phone: [phone] Fax: [phone]

## 2020-03-23 ENCOUNTER — Ambulatory Visit: Payer: Managed Care, Other (non HMO) | Admitting: Physical Therapy

## 2020-03-30 ENCOUNTER — Encounter: Payer: Self-pay | Admitting: Physical Therapy

## 2020-03-30 ENCOUNTER — Ambulatory Visit: Payer: Managed Care, Other (non HMO) | Admitting: Physical Therapy

## 2020-03-30 ENCOUNTER — Other Ambulatory Visit: Payer: Self-pay

## 2020-03-30 DIAGNOSIS — M6281 Muscle weakness (generalized): Secondary | ICD-10-CM

## 2020-03-30 DIAGNOSIS — M25552 Pain in left hip: Secondary | ICD-10-CM | POA: Diagnosis not present

## 2020-03-30 DIAGNOSIS — R29898 Other symptoms and signs involving the musculoskeletal system: Secondary | ICD-10-CM

## 2020-03-30 NOTE — Therapy (Addendum)
[facility] [address] [place], [place], [address] Phone: [phone]   Fax:  [phone]  Physical Therapy Treatment/Discharge   Patient Details  Name: Jill Price MRN: [id number] Date of Birth: 22-Apr-1965 Referring Provider (PT): Dr. Norton Blizzard    Encounter Date: 03/30/2020   PT End of Session - 03/30/20 0920    Visit Number 3    Number of Visits 8    Date for PT Re-Evaluation 04/27/20    PT Start Time 0916    PT Stop Time 1002    PT Time Calculation (min) 46 min    Activity Tolerance Patient tolerated treatment well    Behavior During Therapy [facility] for tasks assessed/performed           Past Medical History:  Diagnosis Date  . Depression    situational- never on meds   . Frequent headaches   . Migraines   . Onychomycosis 05/30/2018    Past Surgical History:  Procedure Laterality Date  . none      There were no vitals filed for this visit.   Subjective Assessment - 03/30/20 0919    Subjective I am feeling alot better.  It doesnt wake me up at night anymore.  No pain right now.    Currently in Pain? No/denies              [facility] Adult PT Treatment/Exercise - 03/30/20 0001      Knee/Hip Exercises: Aerobic   Stationary Bike 5 min L2      Knee/Hip Exercises: Standing   Extension Limitations standing at countertop: green band press back then SLR x 10 each    Functional Squat 15 reps    Functional Squat Limitations 15 lbs KB, 2 sets then dead lift 2 sets x 15 lbs x 15 reps    SLS with Vectors TRX single leg hip hinge x 10    Other Standing Knee Exercises lateral band walking 30 sec x 3 used 15lbs x 1 set    Other Standing Knee Exercises curtsy squat x 10 each LE           seated piriformis between sets        PT Education - 03/30/20 1231    Education Details Lifting form with squats, dead lift    Person(s) Educated Patient    Methods Explanation;Demonstration;Verbal cues     Comprehension Verbalized understanding;Returned demonstration            PT Short Term Goals - 03/30/20 1232      PT SHORT TERM GOAL #1   Title Pt will be I with HEP for hip strength, ankle stability    Baseline does  not do regularly    Status On-going      PT SHORT TERM GOAL #2   Title Pt will be able to maintain min dynamic balance on Rt ankle without loss of balance or pain    Status On-going      PT SHORT TERM GOAL #3   Title Pt will report less instances of pain in L hip when sleeping ,lying on L side during Pilates.    Status Achieved             PT Long Term Goals - 03/30/20 1231      PT LONG TERM GOAL #1   Title Pt will be I with HEP for LE strength and balance, proprioception    Baseline does not do regularly    Status  On-going      PT LONG TERM GOAL #2   Title Pt will be able to have improved confidence with steps in her home and in the community    Status On-going      PT LONG TERM GOAL #3   Title Pt will able to demo hip ankle and knee strength 5/5 in all planes    Status On-going                 Plan - 03/30/20 0945    Clinical Impression Statement Patient doing well and able to complete exercises today without increased pain . She was quickly fatigued with weight exercises. shows decent ankle stability with single leg exercises but L hip less tolerant of abduction, extension exercises.    PT Treatment/Interventions ADLs/Self Care Home Management;Cryotherapy;Taping;Manual techniques;Passive range of motion;Dry needling;Patient/family education;Therapeutic exercise;Ultrasound;Functional mobility training;Neuromuscular re-education;Therapeutic activities;Moist Heat;Iontophoresis 4mg /ml Dexamethasone;Electrical Stimulation;Balance training    PT Next Visit Plan doing HEP, include piriformis stretching, manual/modalities to L hip, SLS    PT Home Exercise Plan [email or web address]    Consulted and Agree with Plan of Care Patient           Patient will benefit  from skilled therapeutic intervention in order to improve the following deficits and impairments:  Decreased activity tolerance,Decreased strength,Increased fascial restricitons,Impaired flexibility,Pain,Decreased mobility,Decreased balance,Increased edema,Difficulty walking  Visit Diagnosis: Pain in left hip  Other symptoms and signs involving the musculoskeletal system  Muscle weakness (generalized)     Problem List Patient Active Problem List   Diagnosis Date Noted  . Hormone replacement therapy (HRT) 09/03/2018  . Weight gain 02/12/2018  . Vitamin D deficiency 08/03/2017  . Bilateral hand pain 07/13/2017  . Menopausal symptoms 07/13/2017    Riddhi Grether 03/30/2020, 12:35 PM  [facility] [address] [place], [place], [address] Phone: [phone]   Fax:  [phone]  Name: Keegan Bensch MRN: [id number] Date of Birth: [birth year]  Raeford Razor, PT 03/30/20 12:36 PM Phone: [phone] Fax: [phone]   PHYSICAL THERAPY DISCHARGE SUMMARY  Visits from Start of Care: 3  Current functional level related to goals / functional outcomes: Unknown    Remaining deficits: Unknown, has not returned    Education / Equipment: HEP, hip strength , alignment  Plan: Patient agrees to discharge.  Patient goals were not met. Patient is being discharged due to not returning since the last visit.  ?????     Raeford Razor, PT 06/09/20 11:30 AM Phone: [phone] Fax: [phone]

## 2020-03-30 NOTE — Patient Instructions (Signed)
Access Code: [email or web address]: [email or web address]: 12/27/2021Prepared by: Anderson Malta PaaExercises  Seated Cervical Sidebending Stretch - 2 x daily - 7 x weekly - 1 sets - 3 reps - 30 hold  Supine Cervical Retraction with Towel - 1 x daily - 7 x weekly - 2 sets - 10 reps - 5 hold  Seated Scapular Retraction - 2 x daily - 7 x weekly - 2 sets - 10 reps - 5 hold  Standing Shoulder Horizontal Abduction with Resistance - 2 x daily - 7 x weekly - 2 sets - 10 reps - 5 hold  Corner Pec Major Stretch - 2 x daily - 7 x weekly - 1 sets - 3 reps - 30 hold

## 2020-04-17 ENCOUNTER — Encounter: Payer: Managed Care, Other (non HMO) | Admitting: Physical Therapy

## 2020-04-17 ENCOUNTER — Ambulatory Visit: Payer: Managed Care, Other (non HMO) | Admitting: Physical Therapy

## 2020-04-24 ENCOUNTER — Encounter: Payer: Managed Care, Other (non HMO) | Admitting: Physical Therapy

## 2020-05-01 ENCOUNTER — Ambulatory Visit: Payer: Managed Care, Other (non HMO) | Admitting: Physical Therapy

## 2020-05-08 ENCOUNTER — Encounter: Payer: Managed Care, Other (non HMO) | Admitting: Physical Therapy

## 2020-05-12 ENCOUNTER — Other Ambulatory Visit: Payer: Self-pay | Admitting: Family Medicine

## 2020-05-15 ENCOUNTER — Ambulatory Visit: Payer: Managed Care, Other (non HMO) | Admitting: Physical Therapy

## 2020-05-22 ENCOUNTER — Encounter: Payer: Managed Care, Other (non HMO) | Admitting: Physical Therapy

## 2020-05-25 NOTE — Progress Notes (Signed)
Phone [phone] In person visit   Subjective:   Jill Price is a 55 y.o. year old very pleasant female patient who presents for/with See problem oriented charting Chief Complaint  Patient presents with   Stress    04/30/2020 patient states that she hasn't been able to sleep past 3:30/4am  Patient states that she was grinding her teeth together so bad that her jaw was sore. She also is under a lot of stress do to work, she states that she is getting moved to another team at work because she was being bullied so that should help with the stress.    This visit occurred during the SARS-CoV-2 public health emergency.  Safety protocols were in place, including screening questions prior to the visit, additional usage of staff PPE, and extensive cleaning of exam room while observing appropriate contact time as indicated for disinfecting solutions.   Past Medical History-  Patient Active Problem List   Diagnosis Date Noted   Hormone replacement therapy (HRT) 09/03/2018    Priority: Medium   Vitamin D deficiency 08/03/2017    Priority: Medium   Menopausal symptoms 07/13/2017    Priority: Medium   Weight gain 02/12/2018    Priority: Low   Bilateral hand pain 07/13/2017    Priority: Low    Medications- reviewed and updated Current Outpatient Medications  Medication Sig Dispense Refill   PREMPRO 0.3-1.5 MG tablet Take 1 tablet by mouth daily. 90 tablet 4   traZODone (DESYREL) 50 MG tablet Take 1 tablet (50 mg total) by mouth at bedtime as needed for sleep. 30 tablet 3   valACYclovir (VALTREX) 1000 MG tablet Take one table by mouth daily 3-5 days for outbreak, then daily as needed. 90 tablet 3   Vitamin D, Ergocalciferol, (DRISDOL) 1.25 MG (50000 UNIT) CAPS capsule TAKE ONE CAPSULE BY MOUTH ONCE WEEKLY (Patient not taking: Reported on 05/26/2020) 4 capsule 2   No current facility-administered medications for this visit.     Objective:  BP 134/66    Pulse 68    Temp  97.7 F (36.5 C) (Temporal)    Ht 5\' 4"  (1.626 m)    Wt 144 lb 12.8 oz (65.7 kg)    LMP 10/01/2016    SpO2 98%    BMI 24.85 kg/m  Gen: NAD, resting comfortably    Assessment and Plan   #Insomnia/stress S: Patient has been under tremendous stress at work.  Has been under an intense situation where she has been bullied-she is being moved to another team to help eliminate/reduce stress. Grinding her teeth at night- worried has moved a crown.   Also lost her mom in 2022/12/16. Sister died in July 16, 2006- had trouble sleeping then. Trazodone was effective in 07/16/2006 used up to every 3rd night if not sleeping and then could use for 2 nights  Has tried advil PM without relief.   Since April 30, 2020 patient has not been able to sleep as 330 or 4 AM.  She is going to bed at 10 (in room by 9 AM) and usually can sleep until 5 30 or 6.  Reads and watches tv in bed. Already has black out shades.  A/P: Situational stress/insomnia-thankful patient is going to be starting a new position and moved away from the bullying boss.   I think a short-term trial of trazodone is reasonable.  Patient is going to reestablish working with Dr. 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   Lab/Order associations:   ICD-10-CM   1. Situational insomnia  F51.09     Meds ordered this encounter  Medications   traZODone (DESYREL) 50 MG tablet    Sig: Take 1 tablet (50 mg total) by mouth at bedtime as needed for sleep.    Dispense:  30 tablet    Refill:  3    Return precautions advised.  Garret Reddish, MD

## 2020-05-25 NOTE — Patient Instructions (Addendum)
Trial trazodone for sleep. I think its reasonable to do every 3rd night if not sleeping well and use for 2 nights.    Recommended follow up: Return in about 4 months (around 09/23/2020) for physical or sooner if needed.

## 2020-05-26 ENCOUNTER — Ambulatory Visit (INDEPENDENT_AMBULATORY_CARE_PROVIDER_SITE_OTHER): Payer: Managed Care, Other (non HMO) | Admitting: Family Medicine

## 2020-05-26 ENCOUNTER — Other Ambulatory Visit: Payer: Self-pay

## 2020-05-26 ENCOUNTER — Encounter: Payer: Self-pay | Admitting: Family Medicine

## 2020-05-26 VITALS — BP 134/66 | HR 68 | Temp 97.7°F | Ht 64.0 in | Wt 144.8 lb

## 2020-05-26 DIAGNOSIS — F5109 Other insomnia not due to a substance or known physiological condition: Secondary | ICD-10-CM

## 2020-05-26 DIAGNOSIS — Z114 Encounter for screening for human immunodeficiency virus [HIV]: Secondary | ICD-10-CM

## 2020-05-26 DIAGNOSIS — Z1159 Encounter for screening for other viral diseases: Secondary | ICD-10-CM

## 2020-05-26 MED ORDER — TRAZODONE HCL 50 MG PO TABS: 50.0000 mg | ORAL_TABLET | Freq: Every evening | ORAL | 3 refills | Status: DC | PRN

## 2020-05-29 ENCOUNTER — Ambulatory Visit: Payer: Managed Care, Other (non HMO) | Admitting: Physical Therapy

## 2020-06-17 ENCOUNTER — Ambulatory Visit (INDEPENDENT_AMBULATORY_CARE_PROVIDER_SITE_OTHER): Payer: Managed Care, Other (non HMO) | Admitting: Family Medicine

## 2020-06-17 ENCOUNTER — Ambulatory Visit: Payer: Self-pay

## 2020-06-17 ENCOUNTER — Encounter: Payer: Self-pay | Admitting: Family Medicine

## 2020-06-17 ENCOUNTER — Other Ambulatory Visit: Payer: Self-pay

## 2020-06-17 VITALS — BP 112/78 | Ht 64.25 in | Wt 141.0 lb

## 2020-06-17 DIAGNOSIS — M79644 Pain in right finger(s): Secondary | ICD-10-CM | POA: Diagnosis not present

## 2020-06-17 DIAGNOSIS — M25522 Pain in left elbow: Secondary | ICD-10-CM | POA: Diagnosis not present

## 2020-06-17 NOTE — Progress Notes (Signed)
PCP: Marin Olp, MD  Subjective:   HPI: Patient is a 55 y.o. female here for right hand and left elbow pain.  Patient reports about 2 months ago she was holding a knife with right hand, brought it upwards and felt a pop about 3rd MCP area of right hand. Associated swelling that has persisted. Pain has improved but still there and gets sharp twinges at times. Also reports for several weeks she's had lateral left elbow pain. Started after shoveling snow. Now radiating down her left arm into the hand and digits. + night pain. Associated decreased grip strength left hand.  Past Medical History:  Diagnosis Date   Depression    situational- never on meds    Frequent headaches    Migraines    Onychomycosis 05/30/2018    Current Outpatient Medications on File Prior to Visit  Medication Sig Dispense Refill   PREMPRO 0.3-1.5 MG tablet Take 1 tablet by mouth daily. 90 tablet 4   traZODone (DESYREL) 50 MG tablet Take 1 tablet (50 mg total) by mouth at bedtime as needed for sleep. 30 tablet 3   valACYclovir (VALTREX) 1000 MG tablet Take one table by mouth daily 3-5 days for outbreak, then daily as needed. 90 tablet 3   Vitamin D, Ergocalciferol, (DRISDOL) 1.25 MG (50000 UNIT) CAPS capsule TAKE ONE CAPSULE BY MOUTH ONCE WEEKLY (Patient not taking: Reported on 05/26/2020) 4 capsule 2   No current facility-administered medications on file prior to visit.    Past Surgical History:  Procedure Laterality Date   none      No Known Allergies  Social History   Socioeconomic History   Marital status: Single    Spouse name: Not on file   Number of children: Not on file   Years of education: Not on file   Highest education level: Not on file  Occupational History   Not on file  Tobacco Use   Smoking status: Never Smoker   Smokeless tobacco: Never Used  Vaping Use   Vaping Use: Never used  Substance and Sexual Activity   Alcohol use: No   Drug use: No    Sexual activity: Not Currently    Birth control/protection: None    Comment: 1st intercourse- 25, partners- 4,   Other Topics Concern   Not on file  Social History Narrative   Single. Lives alone. No pets in 06-28-2019.       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    Social Determinants of Health   Financial Resource Strain: Not on file  Food Insecurity: Not on file  Transportation Needs: Not on file  Physical Activity: Not on file  Stress: Not on file  Social Connections: Not on file  Intimate Partner Violence: Not on file    Family History  Problem Relation Age of Onset   Other Mother        died age 16 in 06-28-2019. she gets med history on half siblings through mom   Heart disease Father    Dementia Father    Diabetes Father    Gout Father    Other Half-Brother        drunk driver   Healthy Half-Brother    Healthy Half-Brother    Lymphoma Half-Sister    Healthy Half-Sister    Healthy Half-Sister    Breast cancer Neg Hx  BP 112/78    Ht 5' 4.25" (1.632 m)    Wt 141 lb (64 kg)    LMP 10/01/2016    BMI 24.01 kg/m   [place] Adult Exercise 02/10/2020  Frequency of aerobic exercise (# of days/week) 4  Average time in minutes 45  Frequency of strengthening activities (# of days/week) 3    No flowsheet data found.  Review of Systems: See HPI above.     Objective:  Physical Exam:  Gen: NAD, comfortable in exam room  Left elbow: No deformity. FROM with 5/5 strength but pain with resisted 3rd digit extension, wrist extension, supination. Tenderness to palpation over lateral epicondyle. NVI distally. Collateral ligaments intact.  Right hand: Swelling without warmth or redness over dorsal 3rd MCP.  No malrotation or angulation. FROM with 5/5 strength at MCP, PIP, DIP joints without pain 3rd digit. No  tenderness to palpation currently. NVI distally.   Limited MSK u/s:  Left common extensor tendon at lateral epicondyle with small insertional interstitial tear, moderate neovascularity as well.  Right 3rd MCP joint without effusion or cortical irregularity.  No extensor tendon tear but small target sign of extensor tendon at level of 3rd MCP.  Assessment & Plan:  1. Left lateral epicondylitis - reviewed home exercises and stretches.  Sleeve.  Icing, voltaren gel.  Discussed wrist brace as well.  Consider physical therapy, nitro patches, injection if not improving.  F/u in 6 weeks.  2. Right 3rd MCP pain - consistent with strain of extensor tendon leading to tenosynovitis.  Reassured.  Icing, voltaren gel.

## 2020-06-17 NOTE — Patient Instructions (Signed)
You have lateral epicondylitis Try to avoid painful activities as much as possible. Ice the area 3-4 times a day for 15 minutes at a time. voltaren gel up to 4 times a day topically for pain and inflammation. Sleeve or counterforce brace as directed can help unload area - wear this regularly if it provides you with relief. Hammer rotation exercise, wrist extension exercise with 1 pound weight - 3 sets of 10 once a day but start with just 1 set.   Stretching - hold for 20-30 seconds and repeat 3 times. Consider physical therapy, injection, nitro patches if not improving. Follow up in 6 weeks.  You strained the extensor tendon of your 3rd digit of your right hand. I'd expect this to continue to slowly improve over the next 4-6 weeks.

## 2020-08-13 ENCOUNTER — Other Ambulatory Visit: Payer: Self-pay | Admitting: Family Medicine

## 2020-09-03 ENCOUNTER — Encounter: Payer: Self-pay | Admitting: Family Medicine

## 2020-09-04 ENCOUNTER — Other Ambulatory Visit: Payer: Self-pay

## 2020-09-04 MED ORDER — PREMPRO 0.3-1.5 MG PO TABS: 1.0000 | ORAL_TABLET | Freq: Every day | ORAL | 4 refills | Status: DC

## 2020-09-04 NOTE — Telephone Encounter (Signed)
  LAST APPOINTMENT DATE: 05/26/2020   NEXT APPOINTMENT DATE:@7 /03/2021  MEDICATION:PREMPRO 0.3-1.5 MG tablet // Vitamin D, Ergocalciferol, (DRISDOL) 1.25 MG (50000 UNIT) CAPS capsule  PHARMACY: [facility]   Comments: Patient is needing this for 3 month supply, and [facility]   Please advise

## 2020-09-24 ENCOUNTER — Telehealth (INDEPENDENT_AMBULATORY_CARE_PROVIDER_SITE_OTHER): Payer: Managed Care, Other (non HMO) | Admitting: Family Medicine

## 2020-09-24 DIAGNOSIS — R0981 Nasal congestion: Secondary | ICD-10-CM

## 2020-09-24 MED ORDER — AMOXICILLIN-POT CLAVULANATE 875-125 MG PO TABS: 1.0000 | ORAL_TABLET | Freq: Two times a day (BID) | ORAL | 0 refills | Status: DC

## 2020-09-24 NOTE — Patient Instructions (Signed)
-  I sent the medication(s) we discussed to your pharmacy: Meds ordered this encounter  Medications   amoxicillin-clavulanate (AUGMENTIN) 875-125 MG tablet    Sig: Take 1 tablet by mouth 2 (two) times daily.    Dispense:  20 tablet    Refill:  0   Consider a covid test.   Flonase 2 sprays each nostril daily for 2 - 3 weeks  Start Zyrtec once daily in the evening.  I hope you are feeling better soon! If not, please schedule a visit with an Ear, Nose and Throat specialist.   Seek in person care promptly if your symptoms worsen or new concerns arise.  It was nice to meet you today. I help [place] out with telemedicine visits on Tuesdays and Thursdays and am available for visits on those days. If you have any concerns or questions following this visit please schedule a follow up visit with your Primary Care doctor or seek care at a local urgent care clinic to avoid delays in care.

## 2020-09-24 NOTE — Progress Notes (Signed)
Virtual Visit via Video Note  I connected with Jill Price  on 09/24/20 at  1:00 PM EDT by a video enabled telemedicine application and verified that I am speaking with the correct person using two identifiers.  Location patient: home, [place] Location provider:work or home office Persons participating in the virtual visit: patient, provider  I discussed the limitations of evaluation and management by telemedicine and the availability of in person appointments. The patient expressed understanding and agreed to proceed.   HPI:  Acute telemedicine visit for sinus issues: -Onset: about 4 weeks ago -Symptoms include: lots of nasal congestion - gets worse at night, had a headache and felt very tired about 4 days ago, some sinus pressure -Denies:CP, SOB, fevers, NVD, inability to eat/drink/get out of bed -Has tried: sudafed, started taking the flonase a few days ago -Pertinent past medical history: some allergies -Pertinent medication allergies: No Known Allergies -COVID-19 vaccine status: vaccinated boosted   ROS: See pertinent positives and negatives per HPI.  Past Medical History:  Diagnosis Date   Depression    situational- never on meds    Frequent headaches    Migraines    Onychomycosis 05/30/2018    Past Surgical History:  Procedure Laterality Date   none       Current Outpatient Medications:    amoxicillin-clavulanate (AUGMENTIN) 875-125 MG tablet, Take 1 tablet by mouth 2 (two) times daily., Disp: 20 tablet, Rfl: 0   PREMPRO 0.3-1.5 MG tablet, Take 1 tablet by mouth daily., Disp: 90 tablet, Rfl: 4   traZODone (DESYREL) 50 MG tablet, Take 1 tablet (50 mg total) by mouth at bedtime as needed for sleep., Disp: 30 tablet, Rfl: 3   valACYclovir (VALTREX) 1000 MG tablet, Take one table by mouth daily 3-5 days for outbreak, then daily as needed., Disp: 90 tablet, Rfl: 3   Vitamin D, Ergocalciferol, (DRISDOL) 1.25 MG (50000 UNIT) CAPS capsule, TAKE ONE CAPSULE BY MOUTH ONCE WEEKLY,  Disp: 4 capsule, Rfl: 2  EXAM:  VITALS per patient if applicable:  GENERAL: alert, oriented, appears well and in no acute distress  HEENT: atraumatic, conjunttiva clear, no obvious abnormalities on inspection of external nose and ears  NECK: normal movements of the head and neck  LUNGS: on inspection no signs of respiratory distress, breathing rate appears normal, no obvious gross SOB, gasping or wheezing  CV: no obvious cyanosis  MS: moves all visible extremities without noticeable abnormality  PSYCH/NEURO: pleasant and cooperative, no obvious depression or anxiety, speech and thought processing grossly intact  ASSESSMENT AND PLAN:  Discussed the following assessment and plan:  Nasal sinus congestion  -we discussed possible serious and likely etiologies, options for evaluation and workup, limitations of telemedicine visit vs in person visit, treatment, treatment risks and precautions. Pt prefers to treat via telemedicine empirically rather than in person at this moment. Query sinusitis, allergic rhinitis vs other. She opted for trial of nasal saline, flonase and zyrtec, but starting Augmenin for 10 days if not improving promptly or any worsening. Advised inperson eval (ENT would be good and discussed options) if worsening, new symptoms arise, or if is not improving with treatment. Discussed options for inperson care if PCP office not available. Did let this patient know that I only do telemedicine on Tuesdays and Thursdays for [place]. Advised to schedule follow up visit with PCP or [facility] if any further questions or concerns to avoid delays in care.   I discussed the assessment and treatment plan with the patient. The patient was provided  an opportunity to ask questions and all were answered. The patient agreed with the plan and demonstrated an understanding of the instructions.     Lucretia Kern, DO

## 2020-10-13 ENCOUNTER — Encounter: Payer: Managed Care, Other (non HMO) | Admitting: Family Medicine

## 2020-11-11 ENCOUNTER — Other Ambulatory Visit: Payer: Self-pay | Admitting: Family Medicine

## 2021-01-08 ENCOUNTER — Other Ambulatory Visit (HOSPITAL_COMMUNITY)
Admission: RE | Admit: 2021-01-08 | Discharge: 2021-01-08 | Disposition: A | Discharge status: Home / Self Care | Payer: Managed Care, Other (non HMO) | Source: Ambulatory Visit | Attending: Obstetrics & Gynecology | Admitting: Obstetrics & Gynecology

## 2021-01-08 ENCOUNTER — Ambulatory Visit (INDEPENDENT_AMBULATORY_CARE_PROVIDER_SITE_OTHER): Payer: Managed Care, Other (non HMO) | Admitting: Obstetrics & Gynecology

## 2021-01-08 ENCOUNTER — Other Ambulatory Visit: Payer: Self-pay

## 2021-01-08 ENCOUNTER — Encounter: Payer: Self-pay | Admitting: Obstetrics & Gynecology

## 2021-01-08 VITALS — BP 106/70 | HR 69 | Resp 16 | Ht 64.25 in | Wt 145.0 lb

## 2021-01-08 DIAGNOSIS — N870 Mild cervical dysplasia: Secondary | ICD-10-CM | POA: Insufficient documentation

## 2021-01-08 DIAGNOSIS — Z01419 Encounter for gynecological examination (general) (routine) without abnormal findings: Secondary | ICD-10-CM | POA: Diagnosis present

## 2021-01-08 DIAGNOSIS — Z79890 Hormone replacement therapy (postmenopausal): Secondary | ICD-10-CM | POA: Diagnosis not present

## 2021-01-08 DIAGNOSIS — R635 Abnormal weight gain: Secondary | ICD-10-CM | POA: Diagnosis not present

## 2021-01-08 MED ORDER — PREMPRO 0.3-1.5 MG PO TABS: 1.0000 | ORAL_TABLET | Freq: Every day | ORAL | 4 refills | Status: DC

## 2021-01-08 NOTE — Progress Notes (Signed)
Kelby Lotspeich 1965/04/21 [id number]   History:    55 y.o. G1P1L1 Divorced.  Son lives in [place] with his father.   RP:  Established patient presenting for annual gyn exam    HPI: Postmenopause, well on Prempro.  No PMB.  No pelvic pain.  Not sexually active since 07/2019.  Breasts normal.  Urine/BMs normal.  BMI 24.7.  Pilates. Walking. [facility].  Health labs with Fam MD.   Past medical history,surgical history, family history and social history were all reviewed and documented in the EPIC chart.  Gynecologic History Patient's last menstrual period was 10/01/2016.  Obstetric History OB History  Gravida Para Term Preterm AB Living  $Remov'1 1       1  'MmPDon$ SAB IAB Ectopic Multiple Live Births               # Outcome Date GA Lbr Len/2nd Weight Sex Delivery Anes PTL Lv  1 Para              ROS: A ROS was performed and pertinent positives and negatives are included in the history.  GENERAL: No fevers or chills. HEENT: No change in vision, no earache, sore throat or sinus congestion. NECK: No pain or stiffness. CARDIOVASCULAR: No chest pain or pressure. No palpitations. PULMONARY: No shortness of breath, cough or wheeze. GASTROINTESTINAL: No abdominal pain, nausea, vomiting or diarrhea, melena or bright red blood per rectum. GENITOURINARY: No urinary frequency, urgency, hesitancy or dysuria. MUSCULOSKELETAL: No joint or muscle pain, no back pain, no recent trauma. DERMATOLOGIC: No rash, no itching, no lesions. ENDOCRINE: No polyuria, polydipsia, no heat or cold intolerance. No recent change in weight. HEMATOLOGICAL: No anemia or easy bruising or bleeding. NEUROLOGIC: No headache, seizures, numbness, tingling or weakness. PSYCHIATRIC: No depression, no loss of interest in normal activity or change in sleep pattern.     Exam:   BP 106/70   Pulse 69   Resp 16   Ht 5' 4.25" (1.632 m)   Wt 145 lb (65.8 kg)   LMP 10/01/2016   BMI 24.70 kg/m   Body mass index is 24.7  kg/m.  General appearance : Well developed well nourished female. No acute distress HEENT: Eyes: no retinal hemorrhage or exudates,  Neck supple, trachea midline, no carotid bruits, no thyroidmegaly Lungs: Clear to auscultation, no rhonchi or wheezes, or rib retractions  Heart: Regular rate and rhythm, no murmurs or gallops Breast:Examined in sitting and supine position were symmetrical in appearance, no palpable masses or tenderness,  no skin retraction, no nipple inversion, no nipple discharge, no skin discoloration, no axillary or supraclavicular lymphadenopathy Abdomen: no palpable masses or tenderness, no rebound or guarding Extremities: no edema or skin discoloration or tenderness  Pelvic: Vulva: Normal             Vagina: No gross lesions or discharge  Cervix: No gross lesions or discharge.  Pap reflex done.  Uterus  AV, normal size, shape and consistency, non-tender and mobile  Adnexa  Without masses or tenderness  Anus: Normal   Assessment/Plan:  54 y.o. female for annual exam   1. Encounter for routine gynecological examination with Papanicolaou smear of cervix Normal gynecologic exam.  ASCUS again on Pap test in 2021, but high risk HPV negative.  History of CIN-1.  Pap reflex repeated today.  Breast exam normal.  Screening mammogram November 2021 was negative.  Cologuard in 2019.  We will follow-up here for fasting health labs. - Cytology - PAP( [facility]) - CBC; Future - Comp Met (CMET); Future - TSH; Future - Lipid Profile; Future - Vitamin D 1,25 dihydroxy; Future   2. Dysplasia of cervix, low grade (CIN 1) - Cytology - PAP( [facility])  3. Postmenopausal hormone replacement therapy Well on Prempro.  No postmenopausal bleeding.  No contraindication to continue.  Prescription sent to pharmacy.  4. Weight gain Referred to nutritionist.  Other orders - PREMPRO 0.3-1.5 MG tablet; Take 1 tablet by mouth daily.   [name] MD, 1:36 PM 01/08/2021

## 2021-01-09 DIAGNOSIS — R635 Abnormal weight gain: Secondary | ICD-10-CM

## 2021-01-11 LAB — CYTOLOGY - PAP: Diagnosis: NEGATIVE

## 2021-01-12 ENCOUNTER — Other Ambulatory Visit: Payer: Managed Care, Other (non HMO)

## 2021-01-12 ENCOUNTER — Other Ambulatory Visit: Payer: Self-pay

## 2021-01-12 DIAGNOSIS — Z01419 Encounter for gynecological examination (general) (routine) without abnormal findings: Secondary | ICD-10-CM

## 2021-01-18 ENCOUNTER — Encounter: Payer: Self-pay | Admitting: *Deleted

## 2021-01-18 LAB — COMPREHENSIVE METABOLIC PANEL
AG Ratio: 1.8 (calc) (ref 1.0–2.5)
ALT: 13 U/L (ref 6–29)
AST: 15 U/L (ref 10–35)
Albumin: 4.3 g/dL (ref 3.6–5.1)
Alkaline phosphatase (APISO): 88 U/L (ref 37–153)
BUN: 12 mg/dL (ref 7–25)
CO2: 27 mmol/L (ref 20–32)
Calcium: 9.3 mg/dL (ref 8.6–10.4)
Chloride: 104 mmol/L (ref 98–110)
Creat: 0.71 mg/dL (ref 0.50–1.03)
Globulin: 2.4 g/dL (calc) (ref 1.9–3.7)
Glucose, Bld: 85 mg/dL (ref 65–99)
Potassium: 4.4 mmol/L (ref 3.5–5.3)
Sodium: 139 mmol/L (ref 135–146)
Total Bilirubin: 0.6 mg/dL (ref 0.2–1.2)
Total Protein: 6.7 g/dL (ref 6.1–8.1)

## 2021-01-18 LAB — CBC
HCT: 41.6 % (ref 35.0–45.0)
Hemoglobin: 13.3 g/dL (ref 11.7–15.5)
MCH: 28.6 pg (ref 27.0–33.0)
MCHC: 32 g/dL (ref 32.0–36.0)
MCV: 89.5 fL (ref 80.0–100.0)
MPV: 12.2 fL (ref 7.5–12.5)
Platelets: 299 10*3/uL (ref 140–400)
RBC: 4.65 10*6/uL (ref 3.80–5.10)
RDW: 12.4 % (ref 11.0–15.0)
WBC: 6.3 10*3/uL (ref 3.8–10.8)

## 2021-01-18 LAB — LIPID PANEL
Cholesterol: 218 mg/dL — ABNORMAL HIGH (ref <200)
HDL: 87 mg/dL (ref >=50)
LDL Cholesterol (Calc): 107 mg/dL (calc) — ABNORMAL HIGH
Non-HDL Cholesterol (Calc): 131 mg/dL (calc) — ABNORMAL HIGH (ref <130)
Total CHOL/HDL Ratio: 2.5 (calc) (ref <5.0)
Triglycerides: 128 mg/dL (ref <150)

## 2021-01-18 LAB — VITAMIN D 1,25 DIHYDROXY
Vitamin D 1, 25 (OH)2 Total: 82 pg/mL — ABNORMAL HIGH (ref 18–72)
Vitamin D2 1, 25 (OH)2: 56 pg/mL
Vitamin D3 1, 25 (OH)2: 26 pg/mL

## 2021-01-18 LAB — TSH: TSH: 1.82 mIU/L (ref 0.40–4.50)

## 2021-01-26 NOTE — Telephone Encounter (Signed)
The last Cologard result I see in her chart is 12/21/2017 ordered by her PCP Dr. Yong Channel.

## 2021-01-27 ENCOUNTER — Other Ambulatory Visit: Payer: Self-pay

## 2021-01-27 DIAGNOSIS — Z1211 Encounter for screening for malignant neoplasm of colon: Secondary | ICD-10-CM

## 2021-01-27 DIAGNOSIS — Z1212 Encounter for screening for malignant neoplasm of rectum: Secondary | ICD-10-CM

## 2021-01-29 ENCOUNTER — Other Ambulatory Visit: Payer: Self-pay | Admitting: Obstetrics & Gynecology

## 2021-01-29 DIAGNOSIS — Z1231 Encounter for screening mammogram for malignant neoplasm of breast: Secondary | ICD-10-CM

## 2021-02-08 ENCOUNTER — Other Ambulatory Visit: Payer: Self-pay

## 2021-02-08 DIAGNOSIS — Z1212 Encounter for screening for malignant neoplasm of rectum: Secondary | ICD-10-CM

## 2021-02-08 DIAGNOSIS — Z1211 Encounter for screening for malignant neoplasm of colon: Secondary | ICD-10-CM

## 2021-02-08 MED ORDER — VALACYCLOVIR HCL 1 G PO TABS: ORAL_TABLET | ORAL | 1 refills | Status: DC

## 2021-02-15 ENCOUNTER — Ambulatory Visit
Admission: RE | Admit: 2021-02-15 | Discharge: 2021-02-15 | Disposition: A | Discharge status: Home / Self Care | Payer: Managed Care, Other (non HMO) | Source: Ambulatory Visit | Attending: Obstetrics & Gynecology | Admitting: Obstetrics & Gynecology

## 2021-02-15 DIAGNOSIS — Z1231 Encounter for screening mammogram for malignant neoplasm of breast: Secondary | ICD-10-CM

## 2021-02-19 LAB — COLOGUARD: COLOGUARD: NEGATIVE

## 2021-03-02 ENCOUNTER — Other Ambulatory Visit: Payer: Self-pay | Admitting: Obstetrics & Gynecology

## 2021-04-01 ENCOUNTER — Telehealth: Payer: Self-pay | Admitting: *Deleted

## 2021-04-01 NOTE — Telephone Encounter (Signed)
Patient called asking if cholesterol and vitamin d level can be rechecked? Patient reports she was fasting at this visit.Results are abnormal and she has not change in diet and walks daily. Please advise

## 2021-04-06 ENCOUNTER — Ambulatory Visit (INDEPENDENT_AMBULATORY_CARE_PROVIDER_SITE_OTHER): Payer: 59 | Admitting: Family Medicine

## 2021-04-06 ENCOUNTER — Other Ambulatory Visit: Payer: Self-pay

## 2021-04-06 ENCOUNTER — Encounter: Payer: Self-pay | Admitting: Family Medicine

## 2021-04-06 VITALS — BP 130/84 | HR 74 | Temp 97.9°F | Ht 64.25 in | Wt 151.8 lb

## 2021-04-06 DIAGNOSIS — Z0000 Encounter for general adult medical examination without abnormal findings: Secondary | ICD-10-CM | POA: Diagnosis not present

## 2021-04-06 DIAGNOSIS — F5109 Other insomnia not due to a substance or known physiological condition: Secondary | ICD-10-CM

## 2021-04-06 DIAGNOSIS — E785 Hyperlipidemia, unspecified: Secondary | ICD-10-CM | POA: Diagnosis not present

## 2021-04-06 DIAGNOSIS — Z114 Encounter for screening for human immunodeficiency virus [HIV]: Secondary | ICD-10-CM

## 2021-04-06 DIAGNOSIS — E559 Vitamin D deficiency, unspecified: Secondary | ICD-10-CM | POA: Diagnosis not present

## 2021-04-06 DIAGNOSIS — Z1159 Encounter for screening for other viral diseases: Secondary | ICD-10-CM

## 2021-04-06 NOTE — Patient Instructions (Addendum)
Wants to hold covid shot for now  Team please change flu shot to December 9th 2022 at pharmacy [facility]  Schedule a lab visit at the check out desk within 2 weeks. Return for future fasting labs meaning nothing but water after midnight please. Ok to take your medications with water.   Could consider therapy for insomnia with Clint Bolder with [facility]- I like your idea for mouth guard and adding exercise (am may be most helpful)  we discussed low dose melatonin 1-3mg  max for 1-2 weeks to reset sleep clock. Continue black out shades. Avoid cell phone, computer, tv within an hour of bed- or at least 30 minutes.   Recommended follow up: Return in about 1 year (around 04/06/2022) for physical or sooner if needed.

## 2021-04-06 NOTE — Progress Notes (Signed)
Phone [phone]   Subjective:  Patient presents today for their annual physical. Chief complaint-noted.   See problem oriented charting- ROS- full  review of systems was completed and negative except for: nasal sinus polyp, fatigue, unexpected weight change, increased thirst  (check cbg with CMP) with weather changes and sudafed- suspect related to sudafed, nocturia once a night, allergies  The following were reviewed and entered/updated in epic: Past Medical History:  Diagnosis Date   Depression    situational- never on meds    Frequent headaches    Migraines    Onychomycosis 05/30/2018   Patient Active Problem List   Diagnosis Date Noted   Hormone replacement therapy (HRT) 09/03/2018    Priority: Medium    Vitamin D deficiency 08/03/2017    Priority: Medium    Menopausal symptoms 07/13/2017    Priority: Medium    Weight gain 02/12/2018    Priority: Low   Bilateral hand pain 07/13/2017    Priority: Low   Past Surgical History:  Procedure Laterality Date   none      Family History  Problem Relation Age of Onset   Other Mother        died age 66 in 07-02-19. she gets med history on half siblings through mom   Heart disease Father    Dementia Father    Diabetes Father    Gout Father    Other Half-Brother        drunk driver   Healthy Half-Brother    Healthy Half-Brother    Lymphoma Half-Sister    Healthy Half-Sister    Healthy Half-Sister    Breast cancer Neg Hx     Medications- reviewed and updated Current Outpatient Medications  Medication Sig Dispense Refill   PREMPRO 0.3-1.5 MG tablet Take 1 tablet by mouth daily. 90 tablet 4   valACYclovir (VALTREX) 1000 MG tablet TAKE ONE TABLE BY MOUTH DAILY 3-5 DAYS FOR OUTBREAK, THEN DAILY AS NEEDED. 90 tablet 1   Vitamin D, Ergocalciferol, (DRISDOL) 1.25 MG (50000 UNIT) CAPS capsule TAKE 1 CAPSULE WEEKLY 12 capsule 3   No current facility-administered medications for this visit.    Allergies-reviewed and  updated No Known Allergies  Social History   Social History Narrative   Single. Lives alone. No pets in 02-Jul-2019.       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    Objective  Objective:  BP 130/84    Pulse 74    Temp 97.9 F (36.6 C)    Ht 5' 4.25" (1.632 m)    Wt 151 lb 12.8 oz (68.9 kg)    LMP 10/01/2016    SpO2 99%    BMI 25.85 kg/m  Gen: NAD, resting comfortably HEENT: Mucous membranes are moist. Oropharynx normal. Anterior cervical lymph node enlarged per baseline (>10 years and blood work reassuring- mentioned to discuss with ENT) Neck: no thyromegaly CV: RRR no murmurs rubs or gallops Lungs: CTAB no crackles, wheeze, rhonchi Abdomen: soft/nontender/nondistended/normal bowel sounds. No rebound or guarding.  Ext: no edema Skin: warm, dry Neuro: grossly normal, moves all extremities, PERRLA   Assessment and Plan   56 y.o. female presenting for annual physical.  Health Maintenance counseling: 1. Anticipatory guidance: Patient counseled regarding regular dental exams -q6 months, eye exams -yearly but may need another update on glasses,  avoiding smoking and second hand smoke , limiting alcohol to 1 beverage per day-doesn't drink , no illicit drugs.   2. Risk factor reduction:  Advised patient of need for regular exercise and diet rich and fruits and vegetables to reduce risk of heart attack and stroke.  Exercise- walks daily if weather permits plus some jumping jacks and planks- less this year. Hot flashes worse with less exercises. Tsh was checked but not recnelty- ok with repeat Diet/weight management-reasonably healthy diet- would focus on maintenance  Wt Readings from Last 3 Encounters:  04/06/21 151 lb 12.8 oz (68.9 kg)  01/08/21 145 lb (65.8 kg)  06/17/20 141 lb (64 kg)  3. Immunizations/screenings/ancillary  studies- discuss Shingrix-wants to hold off for now- and Omicron/Bivalent booster-not interested at present - otherwise immunizations are up-to-date. -Also discussed hepatitis C and HIV screening  -declines tetanus- knows needs this with cut/scrape Immunization History  Administered Date(s) Administered   Influenza,inj,Quad PF,6+ Mos 02/16/2021   PFIZER(Purple Top)SARS-COV-2 Vaccination 06/14/2019, 07/05/2019, 01/06/2020  4. Cervical cancer screening- Last PAP done 01/08/2021 and repeat recommended every 3 years 5. Breast cancer screening-  breast exam with Dr. Dellis Filbert and mammogram last done 02/15/2021 and yearly planned 6. Colon cancer screening - last Cologuard 02/15/2021 and repeat every 3 yearsplanned 7. Skin cancer screening- follows with Dr. Martin Majestic at [facility] dermatology - had granuloma annulare history Advised regular sunscreen use. Denies worrisome, changing, or new skin lesions.  8. Birth control/STD check-  monogamous with current partner. Gets tested with GYN as needed 9. Osteoporosis screening at 79- will plan on this at the latest-offered early check but would wait until off hormone replacement  10. Smoking associated screening - never smoker  Status of chronic or acute concerns   #Vitamin D deficiency-had been as low as 6 in the past S: Medication: vitamin D 50k units weekly in past- now off completley A/P: was trending up with GYN labs- now off meds- update levels- suspect will start just a maintenance dose such as 1000 units  after -has some 50k left over- discussed perhaps once per 45 days until finished  # Hormone replacement therapy- started with Dr. Tanja Port when menopausal and having a lot of symptoms in addition to low vitamin D. Foggy brain, skin cleared, started to lose weight, sleeping drastically improved but having issues,  hot flashes improved initially- slightly worse lately, improved emotional stability- signifiant improvement and plan had been to continue low  dose.  -overall very helpful- takes at night- continue current meds -she has been on 2-3 years and original plan with Dr. Deborra Medina was 5 years- she has been counseled on potential benefits/risks   #migraines- triggered by light- situational and sunglasses help- a few in last few weeks with new glasses- may talk to eye doctor  #hyperlipidemia S: Medication: none at present -dad with heart disease but was cigar smoker, sedentary, worse diet - mid to late 64s or early 70s Lab Results  Component Value Date   CHOL 218 (H) 01/12/2021   HDL 87 01/12/2021   LDLCALC 107 (H) 01/12/2021   TRIG 128 01/12/2021   CHOLHDL 2.5 01/12/2021   A/P: Mild elevations in lipid panel compared to last year-we discussed 10-year risk of heart attack or stroke remains low at 1.8%-due to variance she would like to recheck. Does have new insurance  #nasal polyp- working with ENT Dr. [place] Car[place] in [place]- treated with flonase and got better then worsened- going for allergy testing soon. Does not want to do flonase  and zyrtec long term.   # Insomnia/Stress S:Last visit patient had reported being under a tremendous amount of stress from work -  intense situation where she had been bullied-she was being moved to another team to help eliminate/reduce stress. Thankfully this year work situation much better.   Also lost her mom in Dec 13, 2019. Sister died in 2006/07/13- had trouble sleeping then. Trazodone was effective in July 13, 2006 used up to every 3rd night if not sleeping and then could use for 2 nights. Had negative effect when retried in 12-Jul-2020. Advil PM is somewhat helpful when used sparingly or nyquil.  - patient saw Dr. Gwenlyn Saran who then left practice -fatigue from poor sleep- trouble sleeping past 3 AM -melatonin did not work in the past A/P: we discussed low dose melatonin 1-3mg  max for 1-2 weeks to reset sleep clock.  -if not improving she will reach out to [place].   #easier bruising- recheck CBC - last checked in  october  #Herpes- patient is compliant on Valtrex 1000 mg as needed for outbreaks  Recommended follow up: Return in about 1 year (around 04/06/2022) for physical or sooner if needed. Future Appointments  Date Time Provider [place]  01/11/2022  1:30 PM [name], MD [facility] None   Lab/Order associations:will come back fasting   ICD-10-CM   1. Preventative health care  Z00.00     2. Vitamin D deficiency  E55.9 VITAMIN D 25 Hydroxy (Vit-D Deficiency, Fractures)    3. Hyperlipidemia, unspecified hyperlipidemia type  E78.5 Lipid panel    Comprehensive metabolic panel    CBC with Differential/Platelet    4. Situational insomnia  F51.09     5. Screening for HIV (human immunodeficiency virus)  Z11.4 HIV Antibody (routine testing w rflx)    6. Encounter for hepatitis C screening test for low risk patient  Z11.59 Hepatitis C antibody     I,Harris Phan,acting as a scribe for Garret Reddish, MD.,have documented all relevant documentation on the behalf of Garret Reddish, MD,as directed by  Garret Reddish, MD while in the presence of Garret Reddish, MD.  I, Garret Reddish, MD, have reviewed all documentation for this visit. The documentation on 04/06/21 for the exam, diagnosis, procedures, and orders are all accurate and complete.   Return precautions advised.  Garret Reddish, MD

## 2021-04-07 NOTE — Telephone Encounter (Signed)
Dr.Lavoie replied "Agree with FLP and Vit D level. "  Patient saw her PCP yesterday (Dr.Hunter) and her ordered labs for patient.

## 2021-04-09 ENCOUNTER — Other Ambulatory Visit (INDEPENDENT_AMBULATORY_CARE_PROVIDER_SITE_OTHER): Payer: 59

## 2021-04-09 ENCOUNTER — Other Ambulatory Visit: Payer: Self-pay

## 2021-04-09 DIAGNOSIS — Z114 Encounter for screening for human immunodeficiency virus [HIV]: Secondary | ICD-10-CM

## 2021-04-09 DIAGNOSIS — E785 Hyperlipidemia, unspecified: Secondary | ICD-10-CM | POA: Diagnosis not present

## 2021-04-09 DIAGNOSIS — Z1159 Encounter for screening for other viral diseases: Secondary | ICD-10-CM

## 2021-04-09 DIAGNOSIS — E559 Vitamin D deficiency, unspecified: Secondary | ICD-10-CM | POA: Diagnosis not present

## 2021-04-09 LAB — LIPID PANEL
Cholesterol: 256 mg/dL — ABNORMAL HIGH (ref 0–200)
HDL: 68 mg/dL (ref >39.00)
LDL Cholesterol: 163 mg/dL — ABNORMAL HIGH (ref 0–99)
NonHDL: 188.04
Total CHOL/HDL Ratio: 4
Triglycerides: 123 mg/dL (ref 0.0–149.0)
VLDL: 24.6 mg/dL (ref 0.0–40.0)

## 2021-04-09 LAB — COMPREHENSIVE METABOLIC PANEL
ALT: 17 U/L (ref 0–35)
AST: 18 U/L (ref 0–37)
Albumin: 4.2 g/dL (ref 3.5–5.2)
Alkaline Phosphatase: 98 U/L (ref 39–117)
BUN: 10 mg/dL (ref 6–23)
CO2: 28 mEq/L (ref 19–32)
Calcium: 9.2 mg/dL (ref 8.4–10.5)
Chloride: 103 mEq/L (ref 96–112)
Creatinine, Ser: 0.87 mg/dL (ref 0.40–1.20)
GFR: 74.08 mL/min (ref >60.00)
Glucose, Bld: 89 mg/dL (ref 70–99)
Potassium: 4 mEq/L (ref 3.5–5.1)
Sodium: 138 mEq/L (ref 135–145)
Total Bilirubin: 0.6 mg/dL (ref 0.2–1.2)
Total Protein: 6.7 g/dL (ref 6.0–8.3)

## 2021-04-09 LAB — CBC WITH DIFFERENTIAL/PLATELET
Basophils Absolute: 0.1 10*3/uL (ref 0.0–0.1)
Basophils Relative: 0.7 % (ref 0.0–3.0)
Eosinophils Absolute: 0.1 10*3/uL (ref 0.0–0.7)
Eosinophils Relative: 0.9 % (ref 0.0–5.0)
HCT: 39.9 % (ref 36.0–46.0)
Hemoglobin: 13.2 g/dL (ref 12.0–15.0)
Lymphocytes Relative: 23.7 % (ref 12.0–46.0)
Lymphs Abs: 1.8 10*3/uL (ref 0.7–4.0)
MCHC: 33.1 g/dL (ref 30.0–36.0)
MCV: 88.5 fl (ref 78.0–100.0)
Monocytes Absolute: 0.7 10*3/uL (ref 0.1–1.0)
Monocytes Relative: 8.4 % (ref 3.0–12.0)
Neutro Abs: 5.2 10*3/uL (ref 1.4–7.7)
Neutrophils Relative %: 66.3 % (ref 43.0–77.0)
Platelets: 472 10*3/uL — ABNORMAL HIGH (ref 150.0–400.0)
RBC: 4.5 Mil/uL (ref 3.87–5.11)
RDW: 13.1 % (ref 11.5–15.5)
WBC: 7.8 10*3/uL (ref 4.0–10.5)

## 2021-04-09 LAB — VITAMIN D 25 HYDROXY (VIT D DEFICIENCY, FRACTURES): VITD: 24.05 ng/mL — ABNORMAL LOW (ref 30.00–100.00)

## 2021-04-09 LAB — TSH: TSH: 2.7 u[IU]/mL (ref 0.35–5.50)

## 2021-04-10 ENCOUNTER — Encounter: Payer: Self-pay | Admitting: Obstetrics & Gynecology

## 2021-04-10 ENCOUNTER — Encounter: Payer: Self-pay | Admitting: Family Medicine

## 2021-04-12 ENCOUNTER — Other Ambulatory Visit (INDEPENDENT_AMBULATORY_CARE_PROVIDER_SITE_OTHER): Payer: 59

## 2021-04-12 ENCOUNTER — Other Ambulatory Visit: Payer: Self-pay

## 2021-04-12 DIAGNOSIS — D75839 Thrombocytosis, unspecified: Secondary | ICD-10-CM

## 2021-04-12 NOTE — Telephone Encounter (Signed)
Patient called and wanted to get her lab worked scheduled can lab work please be scheduled patient is very worried and would like to get this done ASAP. Please call patient back she would like to know if she needs to fast as well.

## 2021-04-12 NOTE — Telephone Encounter (Signed)
Jill Price,  It appears Dr.Lavoie used Z01.419 code for Vitamin D both times when vitamin d level was checked.

## 2021-04-13 LAB — PATHOLOGIST SMEAR REVIEW

## 2021-04-13 LAB — FERRITIN: Ferritin: 76.5 ng/mL (ref 10.0–291.0)

## 2021-04-13 NOTE — Telephone Encounter (Signed)
Spoke with patient.  Questions answered. See account notes.  Patient thankful for call.  Encounter closed.

## 2021-04-14 ENCOUNTER — Encounter: Payer: Self-pay | Admitting: Family Medicine

## 2021-04-15 NOTE — Telephone Encounter (Signed)
Patient has called in requesting appt for wed 1/18.  States she was told to get scheduled into a same day.  There is not a same day open on wed.  I have scheduled patient for 1/16 but prefers to come 1/18.  Would like a call back in regard if she can be worked in.

## 2021-04-15 NOTE — Telephone Encounter (Signed)
I have advised patient.  She will keep appt on Monday.

## 2021-04-15 NOTE — Telephone Encounter (Signed)
Ok to work in on 01/18?

## 2021-04-16 NOTE — Progress Notes (Signed)
Phone [phone] In person visit   Subjective:   Jill Price is a 56 y.o. year old very pleasant female patient who presents for/with See problem oriented charting Chief Complaint  Patient presents with   Follow-up    Pt is here to f/u on labs.     This visit occurred during the SARS-CoV-2 public health emergency.  Safety protocols were in place, including screening questions prior to the visit, additional usage of staff PPE, and extensive cleaning of exam room while observing appropriate contact time as indicated for disinfecting solutions.   Past Medical History-  Patient Active Problem List   Diagnosis Date Noted   Hormone replacement therapy (HRT) 09/03/2018    Priority: Medium    Vitamin D deficiency 08/03/2017    Priority: Medium    Menopausal symptoms 07/13/2017    Priority: Medium    Weight gain 02/12/2018    Priority: Low   Bilateral hand pain 07/13/2017    Priority: Low    Medications- reviewed and updated Current Outpatient Medications  Medication Sig Dispense Refill   amoxicillin-clavulanate (AUGMENTIN) 875-125 MG tablet Take 1 tablet by mouth 2 (two) times daily. 20 tablet 0   PREMPRO 0.3-1.5 MG tablet Take 1 tablet by mouth daily. 90 tablet 4   valACYclovir (VALTREX) 1000 MG tablet TAKE ONE TABLE BY MOUTH DAILY 3-5 DAYS FOR OUTBREAK, THEN DAILY AS NEEDED. 90 tablet 1   Vitamin D, Ergocalciferol, (DRISDOL) 1.25 MG (50000 UNIT) CAPS capsule TAKE 1 CAPSULE WEEKLY 12 capsule 3   No current facility-administered medications for this visit.     Objective:  BP 130/80    Pulse 79    Temp 97.9 F (36.6 C)    Ht 5' 4.25" (1.632 m)    Wt 149 lb (67.6 kg)    LMP 10/01/2016    SpO2 98%    BMI 25.38 kg/m  Gen: NAD, resting comfortably Pharynx with some drainage but oropharynx otherwise normal. TM normal. Nasal turbinates slightly edematous with clear discharge but no polyp noted on left anymore.  Left maxillary and frontal sinus tenderness CV: RRR no  murmurs rubs or gallops Lungs: CTAB no crackles, wheeze, rhonchi Ext: no edema Skin: warm, dry Lymph nodes: stable lymph node left neck otherwise no lymphadenopathy in axilla, cervical, supraclavicular, or inguinal regions    Assessment and Plan     #Thrombocytosis #concern for sinusitis S:she worries about inflammation. History of nasal polyp. Following with [facility]. Chronic congestion for 6-8 months but more congested since Saturday. Did take a sudafed on Saturday morning. Did flonase before bed (she wants to avoid being on this chronically). Has not had antibiotics with that time frame- deviated septum on left when scoped in may and December. She feels completely stopped up- some pressure. Cant get anything out.  - allergy testing pending A/P:  Thrombocytosis mild in context of chronic sinus issues working with ENT but with acute worsening over weekend- could have acute on chronic sinusitis.  We opted to treat with 10-day course of Augmentin given chronicity.  Discussed potential ENT follow-up if not improving - She would like to do repeat blood work 10 days after antibiotics-I think that is reasonable.  She also request inflammatory markers which were ordered with CRP and ESR.  After we discussed covid-19 antibiotic she understands reasoning for deferring this testing.  We will do COVID-19 send out PCR testing with acute worsening as well. -Pending -feb 10 allergy testing -No lymphadenopathy on exam other than right neck  which has been chronic and stable for 10 years and she declines considering biopsy at this time  #hyperlipidemia S: Medication:none  - feels like actually does not eat enough or not eat something as healthy as shed like- hard to get fresh fruits and veggies and hard to do that as a single person. Eating cheerios daily as thought could help - 1 cup of coffee in AM, unsweet tea, water only  - even considering personal chef - does feel like could improve exercise but  does some walking if weather permits as well as jumping jacks and planks. Feels has space to work on.  -ascvd risk of 2.6% - pretty much avoids red meat. Does eat beets once a week with salad Lab Results  Component Value Date   CHOL 256 (H) 04/09/2021   HDL 68.00 04/09/2021   LDLCALC 163 (H) 04/09/2021   TRIG 123.0 04/09/2021   CHOLHDL 4 04/09/2021   A/P: We discussed 10-year ASCVD risk is not elevated-would not pursue statin therapy at this time.  Focus on healthy eating/regular exercise and reevaluate in 1 year     Recommended follow up: No follow-ups on file. Future Appointments  Date Time Provider [place]  01/11/2022  1:30 PM [name], MD [facility] None    Lab/Order associations:   ICD-10-CM   1. Hyperlipidemia, unspecified hyperlipidemia type  E78.5     2. Thrombocytosis  D75.839 CBC with Differential/Platelet    C-reactive protein    Sedimentation rate    3. Sinus pressure  J34.89 Novel Coronavirus, NAA ([facility])      Meds ordered this encounter  Medications   amoxicillin-clavulanate (AUGMENTIN) 875-125 MG tablet    Sig: Take 1 tablet by mouth 2 (two) times daily.    Dispense:  20 tablet    Refill:  0    Time Spent: 42 minutes of total time (10:17 AM- 10:59 AM) was spent on the date of the encounter performing the following actions: chart review prior to seeing the patient, obtaining history, performing a medically necessary exam, counseling on the treatment plan, placing orders, and documenting in our EHR.    I,Jada Bradford,acting as a scribe for Garret Reddish, MD.,have documented all relevant documentation on the behalf of Garret Reddish, MD,as directed by  Garret Reddish, MD while in the presence of Garret Reddish, MD.   I, Garret Reddish, MD, have reviewed all documentation for this visit. The documentation on 04/19/21 for the exam, diagnosis, procedures, and orders are all accurate and complete.   Return precautions advised.  Garret Reddish, MD

## 2021-04-18 ENCOUNTER — Encounter: Payer: Self-pay | Admitting: Family Medicine

## 2021-04-19 ENCOUNTER — Ambulatory Visit (INDEPENDENT_AMBULATORY_CARE_PROVIDER_SITE_OTHER): Payer: 59 | Admitting: Family Medicine

## 2021-04-19 ENCOUNTER — Other Ambulatory Visit: Payer: Self-pay

## 2021-04-19 ENCOUNTER — Encounter: Payer: Self-pay | Admitting: Family Medicine

## 2021-04-19 VITALS — BP 130/80 | HR 79 | Temp 97.9°F | Ht 64.25 in | Wt 149.0 lb

## 2021-04-19 DIAGNOSIS — E785 Hyperlipidemia, unspecified: Secondary | ICD-10-CM | POA: Diagnosis not present

## 2021-04-19 DIAGNOSIS — E559 Vitamin D deficiency, unspecified: Secondary | ICD-10-CM

## 2021-04-19 DIAGNOSIS — F5109 Other insomnia not due to a substance or known physiological condition: Secondary | ICD-10-CM

## 2021-04-19 DIAGNOSIS — D75839 Thrombocytosis, unspecified: Secondary | ICD-10-CM | POA: Diagnosis not present

## 2021-04-19 DIAGNOSIS — J3489 Other specified disorders of nose and nasal sinuses: Secondary | ICD-10-CM

## 2021-04-19 MED ORDER — AMOXICILLIN-POT CLAVULANATE 875-125 MG PO TABS: 1.0000 | ORAL_TABLET | Freq: Two times a day (BID) | ORAL | 0 refills | Status: DC

## 2021-04-19 NOTE — Patient Instructions (Addendum)
Health Maintenance Due  Topic Date Due   Zoster Vaccines- Shingrix (1 of 2)- Please consider getting your shingles shot at your local pharmacy-if received, please let us know.  Never done   COVID-19 Vaccine (4 - Booster for [employer] series)- Please consider getting your bivalent booster shot at your local pharmacy- if received, please let us know.  03/02/2020   Covid test before you go - lets do a send out.   Please trial Augmentin twice daily for 10 days to help with your sinuses. Also, use your Flonase. If any new, persistent or worsening symptoms-please let us know!  Please continue eating your fruits and vegetables and try exercising to help with inflammation! Try exercising 150 minutes a week (30 minutes a day).  Recommended follow up: No follow-ups on file. Schedule labs 10 days after completion of antibiotics- only after feeling better.

## 2021-04-20 LAB — NOVEL CORONAVIRUS, NAA: SARS-CoV-2, NAA: NOT DETECTED

## 2021-04-20 LAB — SARS-COV-2, NAA 2 DAY TAT

## 2021-04-30 ENCOUNTER — Encounter: Payer: Self-pay | Admitting: Family Medicine

## 2021-05-03 ENCOUNTER — Encounter: Payer: Self-pay | Admitting: Obstetrics & Gynecology

## 2021-05-03 NOTE — Telephone Encounter (Signed)
I have called the number below and was on hold for a while, had to disconnect to room a patient. Will try again later.

## 2021-05-03 NOTE — Telephone Encounter (Signed)
Pt states she only has 3 weeks left of her PremPro and needs to have the pre authorization done asap.

## 2021-05-05 MED ORDER — PREMPRO 0.3-1.5 MG PO TABS: 1.0000 | ORAL_TABLET | Freq: Every day | ORAL | 3 refills | Status: DC

## 2021-05-11 ENCOUNTER — Encounter: Payer: Self-pay | Admitting: Family Medicine

## 2021-05-12 ENCOUNTER — Telehealth: Payer: Self-pay | Admitting: Family Medicine

## 2021-05-12 ENCOUNTER — Other Ambulatory Visit (INDEPENDENT_AMBULATORY_CARE_PROVIDER_SITE_OTHER): Payer: 59

## 2021-05-12 ENCOUNTER — Other Ambulatory Visit: Payer: Self-pay

## 2021-05-12 DIAGNOSIS — D75839 Thrombocytosis, unspecified: Secondary | ICD-10-CM | POA: Diagnosis not present

## 2021-05-12 LAB — CBC WITH DIFFERENTIAL/PLATELET
Basophils Absolute: 0 10*3/uL (ref 0.0–0.1)
Basophils Relative: 0.5 % (ref 0.0–3.0)
Eosinophils Absolute: 0.3 10*3/uL (ref 0.0–0.7)
Eosinophils Relative: 3.8 % (ref 0.0–5.0)
HCT: 41.1 % (ref 36.0–46.0)
Hemoglobin: 13.4 g/dL (ref 12.0–15.0)
Lymphocytes Relative: 31.4 % (ref 12.0–46.0)
Lymphs Abs: 2.3 10*3/uL (ref 0.7–4.0)
MCHC: 32.6 g/dL (ref 30.0–36.0)
MCV: 87.7 fl (ref 78.0–100.0)
Monocytes Absolute: 0.4 10*3/uL (ref 0.1–1.0)
Monocytes Relative: 6 % (ref 3.0–12.0)
Neutro Abs: 4.2 10*3/uL (ref 1.4–7.7)
Neutrophils Relative %: 58.3 % (ref 43.0–77.0)
Platelets: 276 10*3/uL (ref 150.0–400.0)
RBC: 4.68 Mil/uL (ref 3.87–5.11)
RDW: 13.2 % (ref 11.5–15.5)
WBC: 7.2 10*3/uL (ref 4.0–10.5)

## 2021-05-12 LAB — C-REACTIVE PROTEIN: CRP: <1 mg/dL (ref 0.5–20.0)

## 2021-05-12 LAB — VITAMIN B12: Vitamin B-12: 217 pg/mL (ref 211–911)

## 2021-05-12 LAB — SEDIMENTATION RATE: Sed Rate: 14 mm/hr (ref 0–30)

## 2021-05-12 NOTE — Telephone Encounter (Signed)
Noted  

## 2021-05-12 NOTE — Telephone Encounter (Signed)
Patient dropped off physician result paper work to be faxed to [phone]. Placed in Dr. Ronney Lion folder

## 2021-05-13 ENCOUNTER — Encounter: Payer: Self-pay | Admitting: Family Medicine

## 2021-05-14 ENCOUNTER — Other Ambulatory Visit: Payer: 59

## 2021-08-26 ENCOUNTER — Encounter: Payer: Self-pay | Admitting: Family Medicine

## 2021-08-26 NOTE — Telephone Encounter (Signed)
Pt called in and repeated what she asked in the previous msg. She would like to have lab work ordered for Vit D, B, and C. Also Folic acid and protein. She states her nails have begun to peel and crack down the middle. Her last ov was 04/19/21. Please advise

## 2021-08-26 NOTE — Telephone Encounter (Signed)
Pleases schedule f/u visit for this for pt since this is a new/acute issue.

## 2021-09-03 ENCOUNTER — Ambulatory Visit (INDEPENDENT_AMBULATORY_CARE_PROVIDER_SITE_OTHER): Payer: 59 | Admitting: Family Medicine

## 2021-09-03 ENCOUNTER — Encounter: Payer: Self-pay | Admitting: Family Medicine

## 2021-09-03 VITALS — BP 130/62 | HR 65 | Temp 97.6°F | Ht 64.25 in | Wt 146.6 lb

## 2021-09-03 DIAGNOSIS — E785 Hyperlipidemia, unspecified: Secondary | ICD-10-CM

## 2021-09-03 DIAGNOSIS — E538 Deficiency of other specified B group vitamins: Secondary | ICD-10-CM | POA: Diagnosis not present

## 2021-09-03 DIAGNOSIS — L609 Nail disorder, unspecified: Secondary | ICD-10-CM | POA: Diagnosis not present

## 2021-09-03 DIAGNOSIS — E559 Vitamin D deficiency, unspecified: Secondary | ICD-10-CM

## 2021-09-03 LAB — LDL CHOLESTEROL, DIRECT: Direct LDL: 95 mg/dL

## 2021-09-03 LAB — VITAMIN D 25 HYDROXY (VIT D DEFICIENCY, FRACTURES): VITD: 67.81 ng/mL (ref 30.00–100.00)

## 2021-09-03 LAB — VITAMIN B12: Vitamin B-12: 427 pg/mL (ref 211–911)

## 2021-09-03 LAB — TSH: TSH: 1.48 u[IU]/mL (ref 0.35–5.50)

## 2021-09-03 NOTE — Progress Notes (Signed)
Phone [phone] In person visit   Subjective:   Jill Price is a 56 y.o. year old very pleasant female patient who presents for/with See problem oriented charting Chief Complaint  Patient presents with   peeling/cracking fingernails    Pt c/o peeling and cracking fingernails and would like her b12 vit d checked.    Past Medical History-  Patient Active Problem List   Diagnosis Date Noted   Hormone replacement therapy (HRT) 09/03/2018    Priority: Medium    Vitamin D deficiency 08/03/2017    Priority: Medium    Menopausal symptoms 07/13/2017    Priority: Medium    Weight gain 02/12/2018    Priority: Low   Bilateral hand pain 07/13/2017    Priority: Low   B12 deficiency 09/03/2021    Medications- reviewed and updated Current Outpatient Medications  Medication Sig Dispense Refill   PREMPRO 0.3-1.5 MG tablet Take 1 tablet by mouth daily. 90 tablet 3   valACYclovir (VALTREX) 1000 MG tablet TAKE ONE TABLE BY MOUTH DAILY 3-5 DAYS FOR OUTBREAK, THEN DAILY AS NEEDED. 90 tablet 1   Vitamin D, Ergocalciferol, (DRISDOL) 1.25 MG (50000 UNIT) CAPS capsule TAKE 1 CAPSULE WEEKLY 12 capsule 3   No current facility-administered medications for this visit.     Objective:  BP 130/62   Pulse 65   Temp 97.6 F (36.4 C)   Ht 5' 4.25" (1.632 m)   Wt 146 lb 9.6 oz (66.5 kg)   LMP 10/01/2016   SpO2 100%   BMI 24.97 kg/m  Gen: NAD, resting comfortably CV: RRR no murmurs rubs or gallops Lungs: CTAB no crackles, wheeze, rhonchi  Ext: no edema Skin: warm, dry, some longitudinal ridges in nails and slight whitish discoloration of distal portions.    Assessment and Plan   #Fingernail changes S: Patient reports changes to her fingernails a few months ago (does not remember particularly more stressful event in that time frame)-peeling and cracking. Some splitting on distal portion. oing more salads/veggies and protein has been lower- may have seen some of these changes since  this started.  Denies trauma to the nail  She wants to make sure no vitamin deficiencies such as B12 or vitamin D issues   Not taking biotin- wanted to wait until after labs Lab Results  Component Value Date   TSH 2.70 04/09/2021  A/P: Suspect normal aging changes but we will update labs to rule out vitamin deficiencies and evaluate thyroid  #Vitamin D deficiency S: Medication: 50k units once a week right now Last vitamin D Lab Results  Component Value Date   VD25OH 24.05 (L) 04/09/2021  A/P: update vitamin D with labs- hopefully improved   # B12 deficiency S: Current treatment/medication (oral vs. IM): cyanocobalamin 1000 mcg supplement (takes 2) - also on MV with b12  (lower amount) Lab Results  Component Value Date   VITAMINB12 217 05/12/2021  A/P: hopefully improved- update b12 today. Continue current meds for now   #hyperlipidemia S: Medication:none  Lab Results  Component Value Date   CHOL 256 (H) 04/09/2021   HDL 68.00 04/09/2021   LDLCALC 163 (H) 04/09/2021   TRIG 123.0 04/09/2021   CHOLHDL 4 04/09/2021   A/P: has made dietary changes and wants to see if LDL has improved at all. Even if not ascvd risk has been low- working on mild weight loss as well.   Recommended follow up: Return in about 31 weeks (around 04/08/2022) for physical or sooner if needed.Schedule b4 you  leave. Future Appointments  Date Time Provider [place]  01/11/2022  1:30 PM [name], MD [facility] None  04/08/2022  3:00 PM Marin Olp, MD [facility]    Lab/Order associations:   ICD-10-CM   1. Vitamin D deficiency  E55.9 VITAMIN D 25 Hydroxy (Vit-D Deficiency, Fractures)    2. Hyperlipidemia, unspecified hyperlipidemia type  E78.5 TSH    LDL cholesterol, direct    3. B12 deficiency  E53.8 Vitamin B12    4. Fingernail abnormalities  L60.9 Folate RBC    Biotin (Vitamin B7)       Time Spent: 22 minutes of total time (11:50 PM-12:12 PM) was spent on the date of the  encounter performing the following actions: chart review prior to seeing the patient, obtaining history, performing a brief but medically necessary exam of fingernails, counseling on the potential causes but also probability of likely benign, placing orders, and documenting in our EHR.    Return precautions advised.  Garret Reddish, MD

## 2021-09-03 NOTE — Patient Instructions (Addendum)
Please stop by lab before you go If you have mychart- we will send your results within 3 business days of [place] receiving them.  If you do not have mychart- we will call you about results within 5 business days of [place] receiving them.  *please also note that you will see labs on mychart as soon as they post. I will later go in and write notes on them- will say "notes from Dr. Yong Channel"   Recommended follow up: Return in about 31 weeks (around 04/08/2022) for physical or sooner if needed.Schedule b4 you leave.

## 2021-09-04 ENCOUNTER — Encounter: Payer: Self-pay | Admitting: Family Medicine

## 2021-09-06 NOTE — Telephone Encounter (Signed)
See below

## 2021-09-13 LAB — FOLATE RBC: RBC Folate: 576 ng/mL RBC (ref >280)

## 2021-09-13 LAB — BIOTIN (VITAMIN B7): Biotin (Vitamin B7): 1799.6 pg/mL (ref 221.0–3004.0)

## 2021-09-14 ENCOUNTER — Encounter: Payer: Self-pay | Admitting: Family Medicine

## 2021-10-19 ENCOUNTER — Other Ambulatory Visit: Payer: Self-pay

## 2021-10-19 ENCOUNTER — Encounter: Payer: Self-pay | Admitting: Family Medicine

## 2021-10-19 MED ORDER — VITAMIN D (ERGOCALCIFEROL) 1.25 MG (50000 UNIT) PO CAPS: 50000.0000 [IU] | ORAL_CAPSULE | ORAL | 3 refills | Status: DC

## 2021-10-29 ENCOUNTER — Encounter: Payer: Self-pay | Admitting: Family Medicine

## 2021-11-01 ENCOUNTER — Other Ambulatory Visit: Payer: Self-pay

## 2021-11-01 NOTE — Telephone Encounter (Signed)
B12 is not currently on med list, ok to send in?

## 2021-11-02 ENCOUNTER — Other Ambulatory Visit: Payer: Self-pay

## 2021-11-02 MED ORDER — VITAMIN B-12 1000 MCG PO TABS: 1000.0000 ug | ORAL_TABLET | Freq: Every day | ORAL | 3 refills | Status: DC

## 2021-11-17 ENCOUNTER — Other Ambulatory Visit: Payer: Self-pay | Admitting: Obstetrics & Gynecology

## 2021-11-17 DIAGNOSIS — Z1231 Encounter for screening mammogram for malignant neoplasm of breast: Secondary | ICD-10-CM

## 2021-12-27 ENCOUNTER — Encounter: Payer: Self-pay | Admitting: *Deleted

## 2022-01-11 ENCOUNTER — Ambulatory Visit: Payer: Managed Care, Other (non HMO) | Admitting: Obstetrics & Gynecology

## 2022-01-12 ENCOUNTER — Encounter: Payer: Self-pay | Admitting: Obstetrics & Gynecology

## 2022-01-12 ENCOUNTER — Ambulatory Visit (INDEPENDENT_AMBULATORY_CARE_PROVIDER_SITE_OTHER): Payer: 59 | Admitting: Obstetrics & Gynecology

## 2022-01-12 ENCOUNTER — Other Ambulatory Visit (HOSPITAL_COMMUNITY)
Admission: RE | Admit: 2022-01-12 | Discharge: 2022-01-12 | Disposition: A | Discharge status: Home / Self Care | Payer: 59 | Source: Ambulatory Visit | Attending: Obstetrics & Gynecology | Admitting: Obstetrics & Gynecology

## 2022-01-12 VITALS — BP 114/70 | HR 67 | Ht 64.25 in | Wt 146.0 lb

## 2022-01-12 DIAGNOSIS — Z01419 Encounter for gynecological examination (general) (routine) without abnormal findings: Secondary | ICD-10-CM | POA: Insufficient documentation

## 2022-01-12 DIAGNOSIS — N870 Mild cervical dysplasia: Secondary | ICD-10-CM | POA: Diagnosis not present

## 2022-01-12 DIAGNOSIS — Z79890 Hormone replacement therapy (postmenopausal): Secondary | ICD-10-CM | POA: Diagnosis not present

## 2022-01-12 MED ORDER — PREMPRO 0.3-1.5 MG PO TABS: 1.0000 | ORAL_TABLET | Freq: Every day | ORAL | 4 refills | Status: DC

## 2022-01-12 NOTE — Progress Notes (Signed)
Jill Price [birth year] [id number]   History:    56 y.o. G1P1L1 Divorced.  Son lives in [place] with his father.   RP:  Established patient presenting for annual gyn exam    HPI: Postmenopause, well on Prempro.  No PMB.  No pelvic pain.  Occasionally sexually active with same friend.  H/O CIN 1.  Pap Neg 01/2021.  Pap reflex today.  Breasts normal. Mammo Neg 02/2021. Urine/BMs normal. Cologuard Neg 2022. BMI 24.87.  [place]. Walking. [facility].  Health labs with Fam MD.   Past medical history,surgical history, family history and social history were all reviewed and documented in the EPIC chart.  Gynecologic History Patient's last menstrual period was 04/14/2016.  Obstetric History OB History  Gravida Para Term Preterm AB Living  '1 1 1     1  '$ SAB IAB Ectopic Multiple Live Births               # Outcome Date GA Lbr Len/2nd Weight Sex Delivery Anes PTL Lv  1 Term              ROS: A ROS was performed and pertinent positives and negatives are included in the history. GENERAL: No fevers or chills. HEENT: No change in vision, no earache, sore throat or sinus congestion. NECK: No pain or stiffness. CARDIOVASCULAR: No chest pain or pressure. No palpitations. PULMONARY: No shortness of breath, cough or wheeze. GASTROINTESTINAL: No abdominal pain, nausea, vomiting or diarrhea, melena or bright red blood per rectum. GENITOURINARY: No urinary frequency, urgency, hesitancy or dysuria. MUSCULOSKELETAL: No joint or muscle pain, no back pain, no recent trauma. DERMATOLOGIC: No rash, no itching, no lesions. ENDOCRINE: No polyuria, polydipsia, no heat or cold intolerance. No recent change in weight. HEMATOLOGICAL: No anemia or easy bruising or bleeding. NEUROLOGIC: No headache, seizures, numbness, tingling or weakness. PSYCHIATRIC: No depression, no loss of interest in normal activity or change in sleep pattern.     Exam:   BP 114/70   Pulse 67   Ht 5' 4.25" (1.632 m)   Wt  146 lb (66.2 kg)   LMP 04/14/2016   SpO2 99%   BMI 24.87 kg/m   Body mass index is 24.87 kg/m.  General appearance : Well developed well nourished female. No acute distress HEENT: Eyes: no retinal hemorrhage or exudates,  Neck supple, trachea midline, no carotid bruits, no thyroidmegaly Lungs: Clear to auscultation, no rhonchi or wheezes, or rib retractions  Heart: Regular rate and rhythm, no murmurs or gallops Breast:Examined in sitting and supine position were symmetrical in appearance, no palpable masses or tenderness,  no skin retraction, no nipple inversion, no nipple discharge, no skin discoloration, no axillary or supraclavicular lymphadenopathy Abdomen: no palpable masses or tenderness, no rebound or guarding Extremities: no edema or skin discoloration or tenderness  Pelvic: Vulva: Normal             Vagina: No gross lesions or discharge  Cervix: No gross lesions or discharge.  Pap reflex done.  Uterus  AV, normal size, shape and consistency, non-tender and mobile  Adnexa  Without masses or tenderness  Anus: Normal   Assessment/Plan:  56 y.o. female for annual exam   1. Encounter for routine gynecological examination with Papanicolaou smear of cervix Postmenopause, well on Prempro.  No PMB.  No pelvic pain.  Occasionally sexually active with same friend.  H/O CIN 1.  Pap Neg 01/2021.  Pap reflex today.  Breasts normal. Mammo Neg 02/2021. Urine/BMs normal. Cologuard  Neg 2022. BMI 24.87.  [place]. Walking. [facility].  Health labs with Fam MD. - Cytology - PAP( [place])  2. Dysplasia of cervix, low grade (CIN 1) - Cytology - PAP( [place])  3. Postmenopausal hormone replacement therapy Postmenopause, well on Prempro.  No PMB.  No pelvic pain. No CI to continue.  Prescription sent to pharmacy.  Other orders - Multiple Vitamin (MULTIVITAMIN PO); Take by mouth. - PREMPRO 0.3-1.5 MG tablet; Take 1 tablet by mouth daily.   [name] MD, 8:36 AM  01/12/2022

## 2022-01-13 LAB — CYTOLOGY - PAP
Adequacy: ABSENT
Diagnosis: NEGATIVE

## 2022-01-24 ENCOUNTER — Telehealth: Payer: Self-pay | Admitting: Family Medicine

## 2022-01-24 ENCOUNTER — Encounter: Payer: Self-pay | Admitting: Family Medicine

## 2022-01-24 ENCOUNTER — Ambulatory Visit (INDEPENDENT_AMBULATORY_CARE_PROVIDER_SITE_OTHER): Payer: 59 | Admitting: Family Medicine

## 2022-01-24 VITALS — BP 130/82 | HR 60 | Temp 98.0°F | Ht 64.5 in | Wt 147.4 lb

## 2022-01-24 DIAGNOSIS — B9689 Other specified bacterial agents as the cause of diseases classified elsewhere: Secondary | ICD-10-CM

## 2022-01-24 DIAGNOSIS — J329 Chronic sinusitis, unspecified: Secondary | ICD-10-CM

## 2022-01-24 MED ORDER — AMOXICILLIN-POT CLAVULANATE 875-125 MG PO TABS: 1.0000 | ORAL_TABLET | Freq: Two times a day (BID) | ORAL | 0 refills | Status: AC

## 2022-01-24 NOTE — Patient Instructions (Addendum)
Bacterial sinusitis based on over 10 days of sinus pressure and congestion with lack of significant improvement with conservative care - Treat with Augmentin for 7 days - If new or worsening symptoms or failure to improve she will let us know - If has substantial improvement but not eradication of symptoms within 7 to 10 days consider elongating course-she can reach out through MyChart  Recommended follow up: Return for as needed for new, worsening, persistent symptoms.

## 2022-01-24 NOTE — Progress Notes (Signed)
Phone [phone] In person visit   Subjective:   Jill Price is a 56 y.o. year old very pleasant female patient who presents for/with See problem oriented charting Chief Complaint  Patient presents with   sinus congestion    Pt states shes been congested for about 10 days, sudafed and afin, pt says she cant keep taking those,she says she cant sleep at night because she cant breathe    Past Medical History-  Patient Active Problem List   Diagnosis Date Noted   B12 deficiency 09/03/2021    Priority: Medium    Hormone replacement therapy (HRT) 09/03/2018    Priority: Medium    Vitamin D deficiency 08/03/2017    Priority: Medium    Menopausal symptoms 07/13/2017    Priority: Medium    Weight gain 02/12/2018    Priority: Low   Bilateral hand pain 07/13/2017    Priority: Low    Medications- reviewed and updated Current Outpatient Medications  Medication Sig Dispense Refill   amoxicillin-clavulanate (AUGMENTIN) 875-125 MG tablet Take 1 tablet by mouth 2 (two) times daily for 7 days. 14 tablet 0   Multiple Vitamin (MULTIVITAMIN PO) Take by mouth.     PREMPRO 0.3-1.5 MG tablet Take 1 tablet by mouth daily. 90 tablet 4   valACYclovir (VALTREX) 1000 MG tablet TAKE ONE TABLE BY MOUTH DAILY 3-5 DAYS FOR OUTBREAK, THEN DAILY AS NEEDED. 90 tablet 1   Vitamin D, Ergocalciferol, (DRISDOL) 1.25 MG (50000 UNIT) CAPS capsule Take 1 capsule (50,000 Units total) by mouth once a week. 12 capsule 3   No current facility-administered medications for this visit.     Objective:  BP 130/82 (BP Location: Left Arm, Patient Position: Sitting)   Pulse 60   Temp 98 F (36.7 C) (Temporal)   Ht 5' 4.5" (1.638 m)   Wt 147 lb 6.4 oz (66.9 kg)   LMP 04/14/2016   SpO2 98%   BMI 24.91 kg/m  Gen: NAD, resting comfortably, nasally voice TM normal bilaterally, nasal septum shifted to the left.  Nasal turbinate erythema noted bilaterally.  Drainage in pharynx noted-mildly erythematous CV: RRR  no murmurs rubs or gallops Lungs: CTAB no crackles, wheeze, rhonchi Ext: no edema Skin: warm, dry    Assessment and Plan   # Sinus congestion/pressure S: Patient reports 10 days of symptoms of sinus congestion and pressure.  Has tried Sudafed and Afrin- has reached 3 day limit. Had to travel and when she got back noted worsening symptoms.   She has difficulty sleeping at night due to the discomfort and not be able to breathe through her nose. Has noted more irritability and feeling run down (didn't even feel like going for her regular walk). Has tried hot showers and rest.  -seeing eye doctor today- has noted more dry eye - no shortness of breath or fever -has not tested for covid  -has seen ENT in the past for nasal polyposis on right- does not feel similar- did get treated with prednisone and flonase at that time in November of last year A/P: Bacterial sinusitis based on over 10 days of sinus pressure and congestion with lack of significant improvement with conservative care - Treat with Augmentin for 7 days - If new or worsening symptoms or failure to improve she will let us know - If has substantial improvement but not eradication of symptoms within 7 to 10 days consider elongating course-she can reach out through MyChart  Recommended follow up: Return for as needed for new,  worsening, persistent symptoms. Future Appointments  Date Time Provider [place]  02/16/2022  2:00 PM [facility] 2 [facility]  04/08/2022  3:00 PM Marin Olp, MD [facility]  01/17/2023  1:30 PM [name], MD [facility] None    Lab/Order associations:   ICD-10-CM   1. Bacterial sinusitis  J32.9    B96.89       Meds ordered this encounter  Medications   amoxicillin-clavulanate (AUGMENTIN) 875-125 MG tablet    Sig: Take 1 tablet by mouth 2 (two) times daily for 7 days.    Dispense:  14 tablet    Refill:  0    Return precautions advised.  Garret Reddish, MD

## 2022-01-24 NOTE — Telephone Encounter (Signed)
Unclear how patient reached after hours line during business hours.  Patient Name: Jill Price Gender: Female DOB: 09-26-1965 Age: 56 Y 11 M 23 D Return Phone Number: [phone] (Primary) Address: City/ State/ Zip: [place]  [address] Client [facility] Client Site [place] [facility] [place] Night Provider Garret Reddish- MD Contact Type Call Who Is Calling Patient / Member / Family / Caregiver Call Type Triage / Clinical Relationship To Patient Self Return Phone Number [phone] (Primary) Chief Complaint Nasal Congestion Reason for Call Symptomatic / Request for Justin states she has had nasal congestion for 10 days now, thinks it may be a sinus infection. Translation No Nurse Assessment Nurse: Tito Dine, RN, Neoma Laming Date/Time Eilene Ghazi Time): 01/24/2022 8:04:55 AM Confirm and document reason for call. If symptomatic, describe symptoms. ---Caller states she has had nasal congestion for 10 days now, thinks it may be a sinus infection. Denies fever, cough. SOB related con congestion. Does the patient have any new or worsening symptoms? ---Yes Will a triage be completed? ---Yes Related visit to physician within the last 2 weeks? ---No Does the PT have any chronic conditions? (i.e. diabetes, asthma, this includes High risk factors for pregnancy, etc.) ---No Is this a behavioral health or substance abuse call? ---No Guidelines Guideline Title Affirmed Question Affirmed Notes Nurse Date/Time Eilene Ghazi Time) Sinus Pain or Congestion [1] Difficulty breathing AND [2] not from stuffy nose (e.g., not relieved by cleaning out the nose) Tito Dine, RN, Neoma Laming 01/24/2022 8:08:01 AM Disp. Time Eilene Ghazi Time) Disposition Final User 01/24/2022 8:13:02 AM Clinical Call Yes Tito Dine, RN, Deborah  Final Disposition 01/24/2022 8:13:02 AM Clinical  Call Yes Tito Dine, RN, Deborah Comments User: Nelwyn Salisbury, RN Date/Time Eilene Ghazi Time): 01/24/2022 8:12:36 AM Caller didn't want me to continue the triage, refused talking to th RN. Caller wanted to have an appt with the Dr, warm transfer call to the office.

## 2022-02-04 ENCOUNTER — Encounter: Payer: Self-pay | Admitting: Family Medicine

## 2022-02-16 ENCOUNTER — Ambulatory Visit
Admission: RE | Admit: 2022-02-16 | Discharge: 2022-02-16 | Disposition: A | Discharge status: Home / Self Care | Payer: 59 | Source: Ambulatory Visit | Attending: Obstetrics & Gynecology | Admitting: Obstetrics & Gynecology

## 2022-02-16 DIAGNOSIS — Z1231 Encounter for screening mammogram for malignant neoplasm of breast: Secondary | ICD-10-CM

## 2022-02-23 ENCOUNTER — Other Ambulatory Visit: Payer: Self-pay | Admitting: Obstetrics & Gynecology

## 2022-02-26 ENCOUNTER — Encounter: Payer: Self-pay | Admitting: Obstetrics & Gynecology

## 2022-02-28 NOTE — Telephone Encounter (Signed)
Last annual exam was 01/12/22 patient sent my chart message requesting Rx sent to mail order. Rx sent.

## 2022-03-16 ENCOUNTER — Encounter: Payer: Self-pay | Admitting: Obstetrics & Gynecology

## 2022-03-16 ENCOUNTER — Ambulatory Visit (INDEPENDENT_AMBULATORY_CARE_PROVIDER_SITE_OTHER): Payer: 59 | Admitting: Obstetrics & Gynecology

## 2022-03-16 VITALS — BP 112/80 | HR 68

## 2022-03-16 DIAGNOSIS — L723 Sebaceous cyst: Secondary | ICD-10-CM

## 2022-03-16 NOTE — Progress Notes (Signed)
    Jill Price May 01, 1965 [id number]        56 y.o.  G1P1001   RP: Vulvar bump x >01/2022  HPI: Rt vulvar bump unchanged x 01/2022.  Not painful.  No drainage.  Patient just felt it, not able to visualize it.   OB History  Gravida Para Term Preterm AB Living  '1 1 1     1  '$ SAB IAB Ectopic Multiple Live Births               # Outcome Date GA Lbr Len/2nd Weight Sex Delivery Anes PTL Lv  1 Term             Past medical history,surgical history, problem list, medications, allergies, family history and social history were all reviewed and documented in the EPIC chart.   Directed ROS with pertinent positives and negatives documented in the history of present illness/assessment and plan.  Exam:  Vitals:   03/16/22 1631  BP: 112/80  Pulse: 68  SpO2: 99%   General appearance:  Normal  Gynecologic exam: Vulva:  Rt mid vulva with a small sebaceous gland cyst.  No sign of infection.   Assessment/Plan:  56 y.o. G1P1001   1. Sebaceous cyst Rt vulvar bump unchanged x 01/2022.  Not painful.  No drainage.  Patient just felt it, not able to visualize it.  Very small Rt vulvar sebaceous gland cyst, no sign of infection. Counseling on Sebaceous gland cyst of the vulva.  Reassured.  May take warm baths, warm soaking to reopen the gland duct.    Other orders - B Complex Vitamins (B COMPLEX PO); Take by mouth. - Probiotic Product (PROBIOTIC PO); Take by mouth.   [name] MD, 4:51 PM 03/16/2022

## 2022-03-18 ENCOUNTER — Ambulatory Visit (INDEPENDENT_AMBULATORY_CARE_PROVIDER_SITE_OTHER): Payer: 59 | Admitting: Family Medicine

## 2022-03-18 VITALS — BP 122/70 | HR 63 | Temp 98.0°F | Ht 64.0 in | Wt 147.4 lb

## 2022-03-18 DIAGNOSIS — U071 COVID-19: Secondary | ICD-10-CM

## 2022-03-18 DIAGNOSIS — R519 Headache, unspecified: Secondary | ICD-10-CM

## 2022-03-18 LAB — POC COVID19 BINAXNOW: SARS Coronavirus 2 Ag: POSITIVE — AB

## 2022-03-18 NOTE — Progress Notes (Signed)
Subjective  CC:  Chief Complaint  Patient presents with   Ear Pain    No fever, no cough, she has headache since Sunday, she feels better but she still feels sinus pressure.   Same day acute visit; PCP not available. New pt to me. Chart reviewed.   HPI: Jill Price is a 56 y.o. female who presents to the office today to address the problems listed above in the chief complaint. 56 yo with headache and feels "full" in the head. Global headache x 5-6 days w/o URI sxs, sinus pain, f/c/s, or GI sxs. Has fatigue. No sick contacts. Reviewed chart, h/o sinusitis but this feels different.   Assessment  1. COVID-19      Plan  Covid 19:  Education given, supportive care. Doing well.   Follow up: prn  04/08/2022  Orders Placed This Encounter  Procedures   POC COVID-19   No orders of the defined types were placed in this encounter.     I reviewed the patients updated PMH, FH, and SocHx.    Patient Active Problem List   Diagnosis Date Noted   B12 deficiency 09/03/2021   Hormone replacement therapy (HRT) 09/03/2018   Weight gain 02/12/2018   Vitamin D deficiency 08/03/2017   Bilateral hand pain 07/13/2017   Menopausal symptoms 07/13/2017   Current Meds  Medication Sig   B Complex Vitamins (B COMPLEX PO) Take by mouth.   Multiple Vitamin (MULTIVITAMIN PO) Take by mouth.   PREMPRO 0.3-1.5 MG tablet TAKE 1 TABLET BY MOUTH DAILY   Probiotic Product (PROBIOTIC PO) Take by mouth.   valACYclovir (VALTREX) 1000 MG tablet TAKE ONE TABLE BY MOUTH DAILY 3-5 DAYS FOR OUTBREAK, THEN DAILY AS NEEDED.   Vitamin D, Ergocalciferol, (DRISDOL) 1.25 MG (50000 UNIT) CAPS capsule Take 1 capsule (50,000 Units total) by mouth once a week.    Allergies: Patient has No Known Allergies. Family History: Patient family history includes Dementia in her father; Diabetes in her father; Gout in her father; Healthy in her half-brother, half-brother, half-sister, and half-sister; Heart disease in her  father; Lymphoma in her half-sister; Other in her half-brother and mother. Social History:  Patient  reports that she has never smoked. She has never used smokeless tobacco. She reports that she does not drink alcohol and does not use drugs.  Review of Systems: Constitutional: Negative for fever malaise or anorexia Cardiovascular: negative for chest pain Respiratory: negative for SOB or persistent cough Gastrointestinal: negative for abdominal pain  Objective  Vitals: BP 122/70   Pulse 63   Temp 98 F (36.7 C)   Ht '5\' 4"'$  (1.626 m)   Wt 147 lb 6.4 oz (66.9 kg)   LMP 04/14/2016   SpO2 98%   BMI 25.30 kg/m  General: no acute distress , A&Ox3 HEENT: PEERL, conjunctiva normal, neck is supple, clear TM's and clear OP, no lad Cardiovascular:  RRR without murmur or gallop.  Respiratory:  Good breath sounds bilaterally, CTAB with normal respiratory effort Skin:  Warm, no rashes  Office Visit on 03/18/2022  Component Date Value Ref Range Status   SARS Coronavirus 2 Ag 03/18/2022 Positive (A)  Negative Final    Commons side effects, risks, benefits, and alternatives for medications and treatment plan prescribed today were discussed, and the patient expressed understanding of the given instructions. Patient is instructed to call or message via MyChart if he/she has any questions or concerns regarding our treatment plan. No barriers to understanding were identified. We discussed Red  Flag symptoms and signs in detail. Patient expressed understanding regarding what to do in case of urgent or emergency type symptoms.  Medication list was reconciled, printed and provided to the patient in AVS. Patient instructions and summary information was reviewed with the patient as documented in the AVS. This note was prepared with assistance of Dragon voice recognition software. Occasional wrong-word or sound-a-like substitutions may have occurred due to the inherent limitations of voice recognition  software  This visit occurred during the SARS-CoV-2 public health emergency.  Safety protocols were in place, including screening questions prior to the visit, additional usage of staff PPE, and extensive cleaning of exam room while observing appropriate contact time as indicated for disinfecting solutions.

## 2022-03-21 ENCOUNTER — Encounter: Payer: Self-pay | Admitting: Family Medicine

## 2022-04-08 ENCOUNTER — Encounter: Payer: 59 | Admitting: Family Medicine

## 2022-06-14 ENCOUNTER — Telehealth: Payer: Self-pay

## 2022-06-14 ENCOUNTER — Ambulatory Visit (INDEPENDENT_AMBULATORY_CARE_PROVIDER_SITE_OTHER): Payer: 59 | Admitting: Obstetrics & Gynecology

## 2022-06-14 ENCOUNTER — Encounter: Payer: Self-pay | Admitting: Obstetrics & Gynecology

## 2022-06-14 VITALS — BP 112/70 | HR 70

## 2022-06-14 DIAGNOSIS — R599 Enlarged lymph nodes, unspecified: Secondary | ICD-10-CM

## 2022-06-14 DIAGNOSIS — M79601 Pain in right arm: Secondary | ICD-10-CM

## 2022-06-14 DIAGNOSIS — Z0000 Encounter for general adult medical examination without abnormal findings: Secondary | ICD-10-CM

## 2022-06-14 DIAGNOSIS — R2233 Localized swelling, mass and lump, upper limb, bilateral: Secondary | ICD-10-CM | POA: Diagnosis not present

## 2022-06-14 NOTE — Progress Notes (Signed)
    Jill Price February 15, 1966 [id number]        56 y.o.  G1P1L1   RP: Fullness under armpit, irritation, ache pain going down arms   HPI: Fullness under armpit, irritation, ache pain going down arms.  Postmenopause on Prepro 0.3-1.5.  Trouble sleeping.  Pt request yearly labs today for health form for work.   OB History  Gravida Para Term Preterm AB Living  1 1 1     1   SAB IAB Ectopic Multiple Live Births               # Outcome Date GA Lbr Len/2nd Weight Sex Delivery Anes PTL Lv  1 Term             Past medical history,surgical history, problem list, medications, allergies, family history and social history were all reviewed and documented in the EPIC chart.   Directed ROS with pertinent positives and negatives documented in the history of present illness/assessment and plan.  Exam:  Vitals:   06/14/22 1022  BP: 112/70  Pulse: 70  SpO2: 97%   General appearance:  Normal  Breasts: Lt Breast normal.  Lt axilla mildly tender, small tender LN felt.               Rt Breast normal.  Rt axilla lumpy and tender.  Inguinal LN Negative  Gynecologic exam: Deferred   Assessment/Plan:  57 y.o. G1P1001   1. Axillary tender lumps, bilateral, Rt>Lt Fullness under armpit, irritation, ache pain going down arms.  Postmenopause on Prepro 0.3-1.5.  Trouble sleeping, counseling done.  Tender palpable Axillary LN Rt>Lt.  Recommend stopping HRT until Bilateral Dx Mammo/US including the Axillae.   Screening Mammo Neg 02/2022 at the [facility]. Sister with a Dx of Lymphoma at age 48.  No Fam H/O Breast Ca or other Ca.   2. Health care maintenance Pt request yearly labs today for health form for work. - Lipid Profile - Comp Met (CMET) - CBC - TSH - Vitamin D (25 hydroxy)   Jill Bruins MD, 10:35 AM 06/14/2022

## 2022-06-14 NOTE — Telephone Encounter (Signed)
Spoke with Jill Price at [facility] and scheduled patient for 07/27/22 at 2:20pm to arrive for check in at 2pm.  Spoke with patient and informed her.

## 2022-06-14 NOTE — Telephone Encounter (Signed)
Orders placed with [facility]

## 2022-06-14 NOTE — Telephone Encounter (Signed)
[facility] for Dx bilateral mammo/axillae and Bilateral US of Breasts/Axillae Received: Today [name], MD  P [facility] Triage Fullness under armpit, irritation, ache pain going down arms.  Postmenopause on Prepro 0.3-1.5.  Trouble sleeping, counseling done.  Tender palpable Axillary LN Rt>Lt.  Recommend stopping HRT until Bilateral Dx Mammo/US including the Axillae.   Screening Mammo Neg 02/2022 at the [facility]. Sister with a Dx of Lymphoma at age 59.  No Fam H/O Breast Ca or other Ca.

## 2022-06-15 ENCOUNTER — Telehealth: Payer: Self-pay

## 2022-06-15 LAB — COMPREHENSIVE METABOLIC PANEL
AG Ratio: 1.9 (calc) (ref 1.0–2.5)
ALT: 18 U/L (ref 6–29)
AST: 20 U/L (ref 10–35)
Albumin: 4.8 g/dL (ref 3.6–5.1)
Alkaline phosphatase (APISO): 93 U/L (ref 37–153)
BUN: 12 mg/dL (ref 7–25)
CO2: 22 mmol/L (ref 20–32)
Calcium: 9.3 mg/dL (ref 8.6–10.4)
Chloride: 103 mmol/L (ref 98–110)
Creat: 0.71 mg/dL (ref 0.50–1.03)
Globulin: 2.5 g/dL (calc) (ref 1.9–3.7)
Glucose, Bld: 95 mg/dL (ref 65–99)
Potassium: 4.1 mmol/L (ref 3.5–5.3)
Sodium: 139 mmol/L (ref 135–146)
Total Bilirubin: 0.8 mg/dL (ref 0.2–1.2)
Total Protein: 7.3 g/dL (ref 6.1–8.1)

## 2022-06-15 LAB — LIPID PANEL
Cholesterol: 236 mg/dL — ABNORMAL HIGH (ref <200)
HDL: 104 mg/dL (ref >=50)
LDL Cholesterol (Calc): 107 mg/dL (calc) — ABNORMAL HIGH
Non-HDL Cholesterol (Calc): 132 mg/dL (calc) — ABNORMAL HIGH (ref <130)
Total CHOL/HDL Ratio: 2.3 (calc) (ref <5.0)
Triglycerides: 140 mg/dL (ref <150)

## 2022-06-15 LAB — CBC
HCT: 42.5 % (ref 35.0–45.0)
Hemoglobin: 14 g/dL (ref 11.7–15.5)
MCH: 28.7 pg (ref 27.0–33.0)
MCHC: 32.9 g/dL (ref 32.0–36.0)
MCV: 87.3 fL (ref 80.0–100.0)
MPV: 11.8 fL (ref 7.5–12.5)
Platelets: 332 10*3/uL (ref 140–400)
RBC: 4.87 10*6/uL (ref 3.80–5.10)
RDW: 12.2 % (ref 11.0–15.0)
WBC: 8 10*3/uL (ref 3.8–10.8)

## 2022-06-15 LAB — VITAMIN D 25 HYDROXY (VIT D DEFICIENCY, FRACTURES): Vit D, 25-Hydroxy: 74 ng/mL (ref 30–100)

## 2022-06-15 LAB — TSH: TSH: 1.69 mIU/L (ref 0.40–4.50)

## 2022-06-15 NOTE — Telephone Encounter (Signed)
Patient aware that her health screening form was completed & faxed in to [phone]

## 2022-06-20 ENCOUNTER — Encounter: Payer: Self-pay | Admitting: Family Medicine

## 2022-06-20 ENCOUNTER — Encounter: Payer: Self-pay | Admitting: Obstetrics & Gynecology

## 2022-06-20 MED ORDER — VALACYCLOVIR HCL 1 G PO TABS: ORAL_TABLET | ORAL | 1 refills | Status: AC

## 2022-06-21 ENCOUNTER — Encounter: Payer: Self-pay | Admitting: Family Medicine

## 2022-06-23 ENCOUNTER — Encounter: Payer: Self-pay | Admitting: Family Medicine

## 2022-06-23 ENCOUNTER — Ambulatory Visit (INDEPENDENT_AMBULATORY_CARE_PROVIDER_SITE_OTHER): Payer: 59 | Admitting: Family Medicine

## 2022-06-23 VITALS — BP 140/94 | HR 78 | Temp 97.2°F | Ht 64.0 in | Wt 151.2 lb

## 2022-06-23 DIAGNOSIS — R2230 Localized swelling, mass and lump, unspecified upper limb: Secondary | ICD-10-CM | POA: Diagnosis not present

## 2022-06-23 DIAGNOSIS — R61 Generalized hyperhidrosis: Secondary | ICD-10-CM | POA: Diagnosis not present

## 2022-06-23 DIAGNOSIS — K5900 Constipation, unspecified: Secondary | ICD-10-CM

## 2022-06-23 DIAGNOSIS — L609 Nail disorder, unspecified: Secondary | ICD-10-CM | POA: Diagnosis not present

## 2022-06-23 NOTE — Progress Notes (Signed)
Phone [phone] In person visit   Subjective:   Jill Price is a 57 y.o. year old very pleasant female patient who presents for/with See problem oriented charting Chief Complaint  Patient presents with   Elevated Hepatic Enzymes    Pt is her to discuss elevated ALT.   Past Medical History-  Patient Active Problem List   Diagnosis Date Noted   B12 deficiency 09/03/2021    Priority: Medium    Hormone replacement therapy (HRT) 09/03/2018    Priority: Medium    Vitamin D deficiency 08/03/2017    Priority: Medium    Menopausal symptoms 07/13/2017    Priority: Medium    Weight gain 02/12/2018    Priority: Low   Bilateral hand pain 07/13/2017    Priority: Low    Medications- reviewed and updated Current Outpatient Medications  Medication Sig Dispense Refill   B Complex Vitamins (B COMPLEX PO) Take by mouth.     Multiple Vitamin (MULTIVITAMIN PO) Take by mouth.     PREMPRO 0.3-1.5 MG tablet TAKE 1 TABLET BY MOUTH DAILY 84 tablet 3   Probiotic Product (PROBIOTIC PO) Take by mouth.     valACYclovir (VALTREX) 1000 MG tablet Take one tablet po daily 3-5 days with outbreak, then daily prn. 90 tablet 1   No current facility-administered medications for this visit.     Objective:  BP (!) 140/94   Pulse 78   Temp (!) 97.2 F (36.2 C)   Ht 5\' 4"  (1.626 m)   Wt 151 lb 3.2 oz (68.6 kg)   LMP 04/14/2016 Comment: not sexually active  BMI 25.95 kg/m  Gen: NAD, resting comfortably CV: RRR no murmurs rubs or gallops Lungs: CTAB no crackles, wheeze, rhonchi Bilateral axillary fullness noted in upper portions of the axilla somewhat more firm bilaterally-seems to be slightly fuller on the right.  Also has slight fullness in bilateral groin without discrete nodules.  Chaperone present for both of these portions of exam Skin: warm, dry, does not appear diffusely swollen     Assessment and Plan    # Multiple health concerns S: Patient reports prior history of nasal  polyps related inflammation-the seem to improve over time.  Had also been treated for B12 deficiency.  She has had dry eye in the last year.  She feels like her feet have been redder than usual.  She has felt like she has a general bilateral fullness in her groin over the last year without discrete lymph nodes noted.  She has had fingernail changes for about a year with occasional splitting on the distal portion without trauma to the nail and slight whitish discoloration of the distal portion of the nails-we evaluated for vitamin deficiencies which were adequately repleted June 2023.  She is concerned there could be something wrong with her liver or kidneys-thankfully recent labs for CMP were reassuring  More recently she has noted an engorged feeling in her bilateral axilla with associated "weeping"-she states that she does not perspire so she cannot call the sweating and regardless is a significant change for her.  She has had tenderness in bilateral breasts.  She saw GYN who ordered bilateral diagnostic mammogram as well as ultrasound of bilateral axilla has been ordered-it is frustrating as she has not been able to get in for this for over a month and she would really like to be seen sooner.  Also in recent weeks has noted the pain just medial to right shoulder blade but feels like it  is more of a deeper sensation  Also the last 15 days she has not had a consistent bowel movement like she usually does.  She typically has 1 every morning.  She took smooth tea for 2 days which was helpful last week but after bowel movement on Saturday and Sunday she has not had a bowel movement again.  She is up-to-date on Cologuard-would be due again November 2025-no recent colonoscopy.  Her stomach has been Gramling in the last several weeks as well.  No bright red blood per rectum, melena, nausea, vomiting.  No unintentional weight loss  She is also frustrated by her HDL elevation without clear explanation, over 30  points.  She is concerned she has some sort of toxin in her body that is trying to come out of the axilla-also has bilateral axillary fullness and almost equal slightly hard area in upper part of axilla. A/P: Patient is very concerned that she has a change in her body/essential issue linking her body changes  Prior vitamin deficiencies have been adequately treated-offered to recheck B12 today but she was hold off on this.  Vitamin D is adequately treated  She reports dry her eyes and more constipation with no decrease in fluid intake and in general reports good fluid intake.  Albumin to creatinine ratio on CMP does not appear dehydrated  Chronic changes of feeling general fullness/puffiness in her groin-seems to have a similar issue in bilateral axilla along with breast tenderness-I am thankful she is getting mammogram and ultrasound-I think that is the most important neck step in her workup.  I do not prescribe her Prempro but I wonder about discontinuing this to see if there is any to see if there is any improvement in her symptomatology-but prescribed by GYN   In regards to 2 pain inside her shoulder blade-offered x-ray or thoracic spine films or referral to sports medicine.  She would like to hold on this for now.  In regards to her constipation/bowel change offered referral to gastroenterology-she declines for now  Reviewed labs in fact and liver and kidneys are reassuring.  There certainly is some fluctuation in her lipids but I felt like the main focus should be reducing cardiovascular risk in assessing this-she would like to hold off on CT calcium scoring for now.  We did discuss changing landscape in regards to HDL and interpretation still not entirely certain long-term implications of higher HDL.  No chest pain or shortness of breath reported thankfully  She has been in contact with [facility] endocrinology Dr. Buddy Duty and currently has a visit for July but can be seen sooner with a referral-she  request that today.  With excessive sweating we placed a referral and made a note that patient is interested in potential endocrine related diagnosis tying her issues together-I told her I was not confident that this existed but did not want to be a barrier for herself.  She also wonders about radon exposure but the radon readings were normal and if there could be any effects from prior COVID-19 vaccinations with last 1 in 2021  Her blood pressure is elevated but I think this is anxiety related-encouraged home monitoring with goal at least less than 135/85 and we opted for follow-up in 6 weeks  Recommended follow up: Return in about 6 weeks (around 08/04/2022) for followup or sooner if needed.Schedule b4 you leave. Future Appointments  Date Time Provider [place]  07/27/2022  2:20 PM [facility] DIAG [place] 1 [facility] CE  07/27/2022  2:30 PM [facility] [place] 1 [facility]BRE[facility] CE  07/27/2022  2:35 PM [facility] [place] 1 [facility]BRE[facility] CE  08/03/2022  9:20 AM Marin Olp, MD [facility]  08/24/2022 11:00 AM Marin Olp, MD [facility]  01/17/2023  1:30 PM [name], MD [facility] None    Lab/Order associations:   ICD-10-CM   1. Perspiration excessive  R61 Ambulatory referral to Endocrinology    CANCELED: Ambulatory referral to Endocrinology      Time Spent: 49 minutes of total time (4:00 PM- 4:49 PM) was spent on the date of the encounter performing the following actions: chart review prior to seeing the patient, obtaining history, performing a medically necessary exam, counseling on patient's questions/concerns/reviewing MyChart questions together, placing orders, and documenting in our EHR.   Return precautions advised.  Garret Reddish, MD

## 2022-06-23 NOTE — Patient Instructions (Addendum)
We will either call you or see alternate below within two weeks about your referral to endocrinology . Our referral specialist will sometimes also send you a mychart link once referral is approved and then you will call the # listed on there (let us know if you do not see this within 2 weeks or have not received call)  Lets see if we can get Dr. Buddy Duty opinion  I agree with you- ultrasound and mammogram most important next step  Could do chest x-ray or thoracic spine films if you change your mind for pain in upper back or refer to sports medicine   With bowel changes- continue to stay well hydrated. I like miralax personally to help with bowel movements. Discussed colonoscopy- you wanted to hold for now  I do not suspect this is radon related. Some issues could be related to hormonal changes with aging.   Repeat blood pressure   Lets discontinue vitamin D high dose- only take 1000 units per day for maintenance after a month  Recommended follow up: Return in about 6 weeks (around 08/04/2022) for followup or sooner if needed.Schedule b4 you leave.

## 2022-06-24 ENCOUNTER — Telehealth: Payer: Self-pay

## 2022-06-24 ENCOUNTER — Telehealth: Payer: Self-pay | Admitting: Family Medicine

## 2022-06-24 NOTE — Telephone Encounter (Signed)
Pt calling to request additional labs be drawn. States she would like to have a full Thyroid Panel performed. She noticed TSH only was performed on 06/14/2022 but she would also like to have a TSH AB, T3, and T4 performed. Requesting to come in today. Please advise.

## 2022-06-24 NOTE — Telephone Encounter (Signed)
Patient requests Order be placed for T3, T4 and antibodies Labs.  Requests the full TSH panel.  Patient states she would like to have the above labs done asap.

## 2022-06-24 NOTE — Telephone Encounter (Signed)
FYI

## 2022-06-24 NOTE — Telephone Encounter (Signed)
You can order tsh, free t3, free t4 under excessive perspiration. Not 100% sure t3 and t4 will be covered but we can try. Is she asking about TRAb (thyroid receptor antibodies)? Also not sure on coverage for that under same diagnosis but if she is willing to pay we can order and then set her up for lab time

## 2022-06-24 NOTE — Telephone Encounter (Signed)
Per ML: "I disagree, her TSH was completely normal.  She can ask her Fam MD or an Integrative MD."   Pt notified and voiced understanding.   Pt desires to know if having a normal TSH automatically precludes that those others will be WNLs as well? Pt states she has been experiencing intermittent sxs for months now that has her concerned and doesn't see the harm in just having levels checked, sxs include but not limited to: Constipation, sensitivity in arms/hands, brain fog, discolored fingernails, and enlarged lymph nodes. Please advise.

## 2022-06-24 NOTE — Telephone Encounter (Signed)
I have sent the below message to pt via mychart.

## 2022-06-24 NOTE — Telephone Encounter (Signed)
Per ML: "Yes.  For the other sxs see family MD."   Pt notified and voiced understanding.

## 2022-06-25 ENCOUNTER — Emergency Department (HOSPITAL_COMMUNITY): Payer: 59

## 2022-06-25 ENCOUNTER — Emergency Department (HOSPITAL_COMMUNITY)
Admission: EM | Admit: 2022-06-25 | Discharge: 2022-06-25 | Disposition: A | Discharge status: Home / Self Care | Payer: 59 | Attending: Emergency Medicine | Admitting: Emergency Medicine

## 2022-06-25 ENCOUNTER — Other Ambulatory Visit: Payer: Self-pay

## 2022-06-25 DIAGNOSIS — R6889 Other general symptoms and signs: Secondary | ICD-10-CM | POA: Diagnosis not present

## 2022-06-25 DIAGNOSIS — R21 Rash and other nonspecific skin eruption: Secondary | ICD-10-CM | POA: Diagnosis present

## 2022-06-25 DIAGNOSIS — R61 Generalized hyperhidrosis: Secondary | ICD-10-CM | POA: Insufficient documentation

## 2022-06-25 DIAGNOSIS — R5383 Other fatigue: Secondary | ICD-10-CM | POA: Diagnosis not present

## 2022-06-25 DIAGNOSIS — M2550 Pain in unspecified joint: Secondary | ICD-10-CM | POA: Diagnosis not present

## 2022-06-25 DIAGNOSIS — M791 Myalgia, unspecified site: Secondary | ICD-10-CM | POA: Insufficient documentation

## 2022-06-25 LAB — CBC WITH DIFFERENTIAL/PLATELET
Abs Immature Granulocytes: 0.02 10*3/uL (ref 0.00–0.07)
Basophils Absolute: 0.1 10*3/uL (ref 0.0–0.1)
Basophils Relative: 1 %
Eosinophils Absolute: 0.2 10*3/uL (ref 0.0–0.5)
Eosinophils Relative: 2 %
HCT: 42 % (ref 36.0–46.0)
Hemoglobin: 13.7 g/dL (ref 12.0–15.0)
Immature Granulocytes: 0 %
Lymphocytes Relative: 28 %
Lymphs Abs: 2.3 10*3/uL (ref 0.7–4.0)
MCH: 29 pg (ref 26.0–34.0)
MCHC: 32.6 g/dL (ref 30.0–36.0)
MCV: 89 fL (ref 80.0–100.0)
Monocytes Absolute: 0.5 10*3/uL (ref 0.1–1.0)
Monocytes Relative: 6 %
Neutro Abs: 5.3 10*3/uL (ref 1.7–7.7)
Neutrophils Relative %: 63 %
Platelets: 315 10*3/uL (ref 150–400)
RBC: 4.72 MIL/uL (ref 3.87–5.11)
RDW: 12.7 % (ref 11.5–15.5)
WBC: 8.3 10*3/uL (ref 4.0–10.5)
nRBC: 0 % (ref 0.0–0.2)

## 2022-06-25 LAB — URINALYSIS, ROUTINE W REFLEX MICROSCOPIC
Bilirubin Urine: NEGATIVE
Glucose, UA: NEGATIVE mg/dL
Hgb urine dipstick: NEGATIVE
Ketones, ur: NEGATIVE mg/dL
Leukocytes,Ua: NEGATIVE
Nitrite: NEGATIVE
Protein, ur: NEGATIVE mg/dL
Specific Gravity, Urine: 1.009 (ref 1.005–1.030)
pH: 7 (ref 5.0–8.0)

## 2022-06-25 LAB — T4, FREE: Free T4: 0.88 ng/dL (ref 0.61–1.12)

## 2022-06-25 LAB — COMPREHENSIVE METABOLIC PANEL
ALT: 20 U/L (ref 0–44)
AST: 21 U/L (ref 15–41)
Albumin: 4.3 g/dL (ref 3.5–5.0)
Alkaline Phosphatase: 87 U/L (ref 38–126)
Anion gap: 11 (ref 5–15)
BUN: 13 mg/dL (ref 6–20)
CO2: 22 mmol/L (ref 22–32)
Calcium: 8.9 mg/dL (ref 8.9–10.3)
Chloride: 105 mmol/L (ref 98–111)
Creatinine, Ser: 0.6 mg/dL (ref 0.44–1.00)
GFR, Estimated: >60 mL/min (ref >60)
Glucose, Bld: 99 mg/dL (ref 70–99)
Potassium: 3.9 mmol/L (ref 3.5–5.1)
Sodium: 138 mmol/L (ref 135–145)
Total Bilirubin: 0.8 mg/dL (ref 0.3–1.2)
Total Protein: 7.3 g/dL (ref 6.5–8.1)

## 2022-06-25 LAB — LIPASE, BLOOD: Lipase: 26 U/L (ref 11–51)

## 2022-06-25 LAB — TSH: TSH: 1.565 u[IU]/mL (ref 0.350–4.500)

## 2022-06-25 LAB — TROPONIN I (HIGH SENSITIVITY): Troponin I (High Sensitivity): 2 ng/L (ref <18)

## 2022-06-25 NOTE — ED Notes (Signed)
Pt refused EKG, states "I don't need one, there's nothing wrong with my heart its my hormones" explained why an EKG would be needed in order to rule out possible heart problems or electrolyte imbalances. Pt cont. To refuse.

## 2022-06-25 NOTE — Discharge Instructions (Signed)
TSH is normal.  T3 and T4 are pending and he may check the results online later today or tomorrow.  Chest x-ray does not show any abnormalities.  You declined CT scan today for further evaluation of your intra-abdominal and intrathoracic organs as well as lymph nodes.  You should follow-up with your primary doctor, endocrinologist and gynecologist as scheduled.  You should continue to get the mammogram as well as ultrasound of your axillae.  As we discussed we cannot rule out malignancy or other concerning pathology today but your symptoms do not appear to have any immediate life threat.

## 2022-06-25 NOTE — ED Triage Notes (Signed)
Pt reports swelling in armpits, abnormal labs, "nails are not normal", "my feet turn bright red" States has referral to endocrinologist but is not scheduled until July

## 2022-06-25 NOTE — ED Provider Notes (Signed)
[place] EMERGENCY DEPARTMENT AT [facility] Provider Note   CSN: [id number] Arrival date & time: 06/25/22  [id number]     History  Chief Complaint  Patient presents with   multiple complaints    Jill Price is a 57 y.o. female.  Patient presents to the ED with multiple complaints.  States she has had abnormal nail changes to her fingers and toes for several months, intermittent redness to her bilateral feet for several months, swelling of her armpits with excessive sweating and diaphoresis from her armpits.  Symptoms been occurring for several months and she has been following with her PCP but is not satisfied with the explanations she has received and is here because "no one is listening to me".  She saw her PCP 2 days ago and was referred to endocrinology who she states she cannot see until July. States she is a generally healthy person and is quite worried about her health.  She states she had a family member die of lymphoma.  She comes in today requesting additional workup for her multiple complaints.  States she feels there are "deep lines" in her nails that are abnormal.  These have been there for many months.  She has intermittent redness to her bilateral feet for many months as well but none currently.  Has noticed swelling to her bilateral armpits with excessive sweating.  She was told by her gynecologist that her breast exam was normal and she was referred to a have a mammogram next month. She is requesting a "full thyroid panel".  She denies any cardiac or pulmonary history.  She takes Prempro chronically as well as some probiotics and vitamins.  No history of diabetes, high blood pressure.   The history is provided by the patient.       Home Medications Prior to Admission medications   Medication Sig Start Date End Date Taking? Authorizing Provider  B Complex Vitamins (B COMPLEX PO) Take by mouth.    [provider]  Multiple Vitamin  (MULTIVITAMIN PO) Take by mouth.    [provider]  PREMPRO 0.3-1.5 MG tablet TAKE 1 TABLET BY MOUTH DAILY 02/28/22   [name], MD  Probiotic Product (PROBIOTIC PO) Take by mouth.    [provider]  valACYclovir (VALTREX) 1000 MG tablet Take one tablet po daily 3-5 days with outbreak, then daily prn. 06/20/22   [name], MD      Allergies    Patient has no known allergies.    Review of Systems   Review of Systems  Constitutional:  Positive for diaphoresis and fatigue. Negative for activity change, appetite change and fever.  HENT:  Negative for congestion and rhinorrhea.   Respiratory:  Negative for cough, chest tightness and shortness of breath.   Cardiovascular:  Negative for chest pain.  Gastrointestinal:  Negative for abdominal pain, nausea and vomiting.  Musculoskeletal:  Positive for arthralgias and myalgias.  Skin:  Positive for rash.  Neurological:  Positive for weakness. Negative for dizziness, light-headedness and headaches.   all other systems are negative except as noted in the HPI and PMH.    Physical Exam Updated Vital Signs BP (!) 158/105   Pulse 93   Temp 98 F (36.7 C) (Oral)   Resp 18   Ht 5\' 4"  (1.626 m)   Wt 68 kg   LMP 04/14/2016 Comment: not sexually active  SpO2 98%   BMI 25.73 kg/m  Physical Exam Vitals and nursing note reviewed.  Constitutional:  General: She is not in acute distress.    Appearance: She is well-developed.     Comments: Anxious appearing  HENT:     Head: Normocephalic and atraumatic.     Mouth/Throat:     Pharynx: No oropharyngeal exudate.  Eyes:     Conjunctiva/sclera: Conjunctivae normal.     Pupils: Pupils are equal, round, and reactive to light.  Neck:     Comments: No meningismus. Cardiovascular:     Rate and Rhythm: Normal rate and regular rhythm.     Heart sounds: Normal heart sounds. No murmur heard. Pulmonary:     Effort: Pulmonary effort is normal. No respiratory  distress.     Breath sounds: Normal breath sounds.  Abdominal:     Palpations: Abdomen is soft.     Tenderness: There is no abdominal tenderness. There is no guarding or rebound.  Musculoskeletal:        General: No tenderness. Normal range of motion.     Cervical back: Normal range of motion and neck supple.     Comments: Feet are not erythematous.  Intact DP and PT pulses  Bilateral axilla, questionably slightly swollen bilaterally.  No fluctuance or erythema.  No palpable lymph nodes.  Skin:    General: Skin is warm.  Neurological:     Mental Status: She is alert and oriented to person, place, and time.     Cranial Nerves: No cranial nerve deficit.     Motor: No abnormal muscle tone.     Coordination: Coordination normal.     Comments:  5/5 strength throughout. CN 2-12 intact.Equal grip strength.   Psychiatric:        Behavior: Behavior normal.     ED Results / Procedures / Treatments   Labs (all labs ordered are listed, but only abnormal results are displayed) Labs Reviewed  URINALYSIS, ROUTINE W REFLEX MICROSCOPIC - Abnormal; Notable for the following components:      Result Value   APPearance HAZY (*)    All other components within normal limits  CBC WITH DIFFERENTIAL/PLATELET  COMPREHENSIVE METABOLIC PANEL  LIPASE, BLOOD  TSH  T4, FREE  T3, FREE  TROPONIN I (HIGH SENSITIVITY)    EKG EKG Interpretation  Date/Time:  Saturday June 25 2022 11:10:25 EDT Ventricular Rate:  69 PR Interval:  160 QRS Duration: 92 QT Interval:  404 QTC Calculation: 433 R Axis:   48 Text Interpretation: Sinus rhythm Anteroseptal infarct, age indeterminate No previous ECGs available Confirmed by Ezequiel Essex [phone]) on 06/25/2022 11:33:58 AM  Radiology DG Chest 2 View  Result Date: 06/25/2022 CLINICAL DATA:  Bilateral upper extremity swelling and facial swelling. EXAM: CHEST - 2 VIEW COMPARISON:  None Available. FINDINGS: The heart size and mediastinal contours are within normal  limits. Both lungs are clear. The visualized skeletal structures are unremarkable. IMPRESSION: No active cardiopulmonary disease. Electronically Signed   By: Marijo Conception M.D.   On: 06/25/2022 10:30    Procedures Procedures    Medications Ordered in ED Medications - No data to display  ED Course/ Medical Decision Making/ A&P                             Medical Decision Making Amount and/or Complexity of Data Reviewed Labs: ordered. Decision-making details documented in ED Course. Radiology: ordered and independent interpretation performed. Decision-making details documented in ED Course. ECG/medicine tests: ordered and independent interpretation performed. Decision-making details documented in ED Course.  Patient here with multiple complaints ongoing for several months.  She describes changes of her fingernails, redness to her feet, swelling her armpits with excessive sweating.  No chest pain or shortness of breath.  No cough or fever.  Saw her PCP 2 days ago for the same complaints and referred to endocrinology.  Discussed with the patient expectations at today's visit.  Discussed that the emergency room cannot rule out life-threatening pathology but will likely not be able to give her an answer for her any complaints ongoing for several months.  She is agreeable to obtain screening labs as well as thyroid studies.  She is concerned only TSH was drawn and is requesting T3 and T4 as well.  Lab work is reassuring.  Patient has declined EKG.  She was agreeable to chest x-ray which is negative for soft tissue masses or mediastinal widening.  Initially she consented to CT scan to help evaluate for SVC syndrome and rule out intrathoracic or intra-abdominal mass or lymphadenopathy.  Patient now states she wants to go home and follow-up with her doctor does not want a CT scan.  She is reassured that labs not drawn today.  Discussed with her that T3 and T4 results would not be back today but  can be checked online later.  Recommend following up with her PCP as well as gynecologist for her scheduled mammogram as well as ultrasound of her axilla. Discussed we have not ruled out lymphadenopathy or any kind malignancy at this time.  Discussed that she does not appear to have an emergent cause of her symptoms and is safe to go home.  She wishes to follow-up with her doctor to reschedule the CT scan.  Heart rate normal.  EKG is normal.  No tachycardia. no evidence of thyroid storm or other life-threatening cause of her concerns  Return precautions given including chest pain, shortness of breath, not able to eat or drink, confusion, any other concerns.         Final Clinical Impression(s) / ED Diagnoses Final diagnoses:  None    Rx / DC Orders ED Discharge Orders     None         Sakshi Sermons, Annie Main, MD 06/25/22 1513

## 2022-06-26 LAB — T3, FREE: T3, Free: 2.8 pg/mL (ref 2.0–4.4)

## 2022-06-28 ENCOUNTER — Telehealth: Payer: Self-pay

## 2022-06-28 NOTE — Transitions of Care (Post Inpatient/ED Visit) (Signed)
   06/28/2022  Name: Jill Price MRN: [id number] DOB: 1965-11-07  Today's TOC FU Call Status: Today's TOC FU Call Status:: Unsuccessul Call (1st Attempt) Unsuccessful Call (1st Attempt) Date: 06/28/22  Attempted to reach the patient regarding the most recent Inpatient/ED visit.  Follow Up Plan: Additional outreach attempts will be made to reach the patient to complete the Transitions of Care (Post Inpatient/ED visit) call.   Signature Reymundo Poll, CMA

## 2022-06-28 NOTE — Transitions of Care (Post Inpatient/ED Visit) (Signed)
   06/28/2022  Name: Jill Price MRN: [id number] DOB: 08-Jan-1966  Today's TOC FU Call Status:    Transition Care Management Follow-up Telephone Call Date of Discharge: 06/26/22 Discharge [facility] Type of Discharge: Emergency Department Reason for ED Visit: Endocrine, Neurologic How have you been since you were released from the hospital?: Same Any questions or concerns?: No  Items Reviewed: Did you receive and understand the discharge instructions provided?: Yes Medications obtained and verified?: Yes (Medications Reviewed) Any new allergies since your discharge?: No Dietary orders reviewed?: NA Do you have support at home?: Yes  Home Care and Equipment/Supplies: [place] Ordered?: NA Any new equipment or medical supplies ordered?: NA  Functional Questionnaire: Do you need assistance with bathing/showering or dressing?: No Do you need assistance with meal preparation?: No Do you need assistance with eating?: No Do you have difficulty maintaining continence: No Do you need assistance with getting out of bed/getting out of a chair/moving?: No Do you have difficulty managing or taking your medications?: No  Follow up appointments reviewed: PCP Follow-up appointment confirmed?: [facility] Follow-up appointment confirmed?: NA Do you need transportation to your follow-up appointment?: No Do you understand care options if your condition(s) worsen?: Yes-patient verbalized understanding  Patient was adamant that no one in our office can help her. States she needs "next level care" with endo or neuro, or someone with a holistic approach. Declined scheduling appt at this time.    [place], [place]

## 2022-06-28 NOTE — Telephone Encounter (Signed)
See below

## 2022-06-28 NOTE — Telephone Encounter (Signed)
Patient returned call. Requests to be called. 

## 2022-06-28 NOTE — Telephone Encounter (Signed)
Patient declined ED f/u States that no one in this office understands or believes that is going on. States she needs "next level care" with endo, neuro, or even someone that takes a holistic approach.

## 2022-07-04 NOTE — Telephone Encounter (Signed)
I actually would be fine with integrative consult as well- I've had several patients see [facility] in [place] and have good success- if you could let her know. And hopefully we can get some answers with endocrinology.   Please let her know we certainly believe what is going on even if we do not have answers yet. I look forward to seeing her on 08/03/22 for follow up as already scheduled (if she's ok with keeping that)

## 2022-07-06 ENCOUNTER — Encounter: Payer: Self-pay | Admitting: Family Medicine

## 2022-07-06 ENCOUNTER — Ambulatory Visit (INDEPENDENT_AMBULATORY_CARE_PROVIDER_SITE_OTHER): Payer: 59 | Admitting: Family Medicine

## 2022-07-06 VITALS — BP 148/90 | HR 76 | Temp 98.0°F | Ht 64.0 in | Wt 150.0 lb

## 2022-07-06 DIAGNOSIS — R232 Flushing: Secondary | ICD-10-CM | POA: Diagnosis not present

## 2022-07-06 DIAGNOSIS — R2230 Localized swelling, mass and lump, unspecified upper limb: Secondary | ICD-10-CM

## 2022-07-06 DIAGNOSIS — L609 Nail disorder, unspecified: Secondary | ICD-10-CM | POA: Diagnosis not present

## 2022-07-06 DIAGNOSIS — K5900 Constipation, unspecified: Secondary | ICD-10-CM | POA: Diagnosis not present

## 2022-07-06 NOTE — Progress Notes (Signed)
Subjective  CC: No chief complaint on file.  Same day acute visit; PCP not available.  Chart reviewed.   HPI: Jill Price is a 57 y.o. female who presents to the office today to address the problems listed above in the chief complaint. 57 year old postmenopausal female presents due to ongoing concerns regarding multiple issues.  I have reviewed recent PCP notes, multiple lab tests, GYN visit and recent ER visit.  To briefly summarize, she has had some changes in her system including flushing her feet and face, typically after walking, manicures or hot showers.  Also flushing with facials recently.  She has noted some swelling in bilateral axilla for which she saw GYN.  Ultrasound and mammogram are scheduled.  She noted nail changes over the last several months and is to see dermatology next week.  Whites of her nails have widened.  No cracking, fissuring or lifting.  She also reports that for several months she has not been able to sleep normally.  Typically only getting 5 hours of sleep per night.  Awakens typically around 2 AM.  She has excellent sleep hygiene and sleep habits.  No clear stressors outside of the stress of feeling that something is off in her body.  No history of anxiety.  Has tried over-the-counter sleep medicines without relief.  Also has noted some constipation which is new and changes in her cholesterol levels that worry her.  She is on hormone replacement therapy.  She has a history of B12 deficiency that has been corrected.  She has no history of hypertension.  No syncope or low blood pressure symptoms.  No unwanted significant weight changes.  She has had multiple lab tests including normal CBC, CMP, TFTs.  She admits to feeling pretty stressed out at this time.  Blood pressure was secondarily elevated likely due to stressors and frustrations today.  Yesterday she noted a new skin finding: Annular rash like lesion on her right abdomen Of note, patient was diagnosed with  COVID in December.  She also admits to some brain fog and has a sensation of feeling bilateral arms feeling heavy at times.  No pain. Review of systems negative for fevers, sweats, night sweats, panic symptoms, chest pain, palpitations, shortness of breath, abdominal pain, melena, urinary symptoms, lower extremity edema, cognitive concerns  Assessment  1. Flushing   2. Fingernail abnormalities   3. Axillary fullness   4. Constipation, unspecified constipation type      Plan  Multiple complaints: We had a long conversation regarding possible multifactorial nature of her different symptomatology.  Currently would like to know what dermatology says about her fingernail abnormalities.  I suspect the flushing is more related to vasomotor symptoms that could be related to postmenopausal changes.  She is on HRT.  She does have GYN involved.  Poor sleep and constipation are likely related.  She is moving her bowels well using senna tea.  We did discuss options of sleep medications.  She should consider Ambien to see if we can get her sleep better regulated.  She will let me know.  Of note, some of the symptoms could be related to her recent COVID infection.  There are no clearly linked symptoms that make me think of a new diagnosis that has not been entertained.  We will monitor her symptomatology and she will follow-up if things worsen, persist or change  I spent a total of 52 minutes for this patient encounter. Time spent included preparation, face-to-face counseling with the  patient and coordination of care, review of chart and records, and documentation of the encounter.  Follow up: As needed with Dr. Yong Channel 08/03/2022  No orders of the defined types were placed in this encounter.  No orders of the defined types were placed in this encounter.     I reviewed the patients updated PMH, FH, and SocHx.    Patient Active Problem List   Diagnosis Date Noted   B12 deficiency 09/03/2021   Hormone  replacement therapy (HRT) 09/03/2018   Weight gain 02/12/2018   Vitamin D deficiency 08/03/2017   Bilateral hand pain 07/13/2017   Menopausal symptoms 07/13/2017   Current Meds  Medication Sig   B Complex Vitamins (B COMPLEX PO) Take by mouth.   Multiple Vitamin (MULTIVITAMIN PO) Take by mouth.   PREMPRO 0.3-1.5 MG tablet TAKE 1 TABLET BY MOUTH DAILY   Probiotic Product (PROBIOTIC PO) Take by mouth.   valACYclovir (VALTREX) 1000 MG tablet Take one tablet po daily 3-5 days with outbreak, then daily prn.    Allergies: Patient has No Known Allergies. Family History: Patient family history includes Dementia in her father; Diabetes in her father; Gout in her father; Healthy in her half-brother, half-brother, half-sister, and half-sister; Heart disease in her father; Lymphoma in her half-sister; Other in her half-brother and mother. Social History:  Patient  reports that she has never smoked. She has never used smokeless tobacco. She reports that she does not drink alcohol and does not use drugs.  Review of Systems: Constitutional: Negative for fever malaise or anorexia Cardiovascular: negative for chest pain Respiratory: negative for SOB or persistent cough Gastrointestinal: negative for abdominal pain  Objective  Vitals: BP (!) 148/90   Pulse 76   Temp 98 F (36.7 C)   Ht 5\' 4"  (1.626 m)   Wt 150 lb (68 kg)   LMP 04/14/2016 Comment: not sexually active  SpO2 98%   BMI 25.75 kg/m  General: no acute distress , A&Ox3 Psych: Anxious appearing, normal speech, normal cognition HEENT: PEERL, conjunctiva normal, neck is supple, no neck masses Bilateral axilla without palpable masses Skin:  Warm, nails without clubbing, fissures, ridges but the whites of the nails are widened Abdominal skin: Faint possible outline that is annular no flaking or redness Extremities without edema  No visits with results within 1 Day(s) from this visit.  Latest known visit with results is:  Admission  on 06/25/2022, Discharged on 06/25/2022  Component Date Value Ref Range Status   WBC 06/25/2022 8.3  4.0 - 10.5 K/uL Final   RBC 06/25/2022 4.72  3.87 - 5.11 MIL/uL Final   Hemoglobin 06/25/2022 13.7  12.0 - 15.0 g/dL Final   HCT 06/25/2022 42.0  36.0 - 46.0 % Final   MCV 06/25/2022 89.0  80.0 - 100.0 fL Final   MCH 06/25/2022 29.0  26.0 - 34.0 pg Final   MCHC 06/25/2022 32.6  30.0 - 36.0 g/dL Final   RDW 06/25/2022 12.7  11.5 - 15.5 % Final   Platelets 06/25/2022 315  150 - 400 K/uL Final   nRBC 06/25/2022 0.0  0.0 - 0.2 % Final   Neutrophils Relative % 06/25/2022 63  % Final   Neutro Abs 06/25/2022 5.3  1.7 - 7.7 K/uL Final   Lymphocytes Relative 06/25/2022 28  % Final   Lymphs Abs 06/25/2022 2.3  0.7 - 4.0 K/uL Final   Monocytes Relative 06/25/2022 6  % Final   Monocytes Absolute 06/25/2022 0.5  0.1 - 1.0 K/uL Final  Eosinophils Relative 06/25/2022 2  % Final   Eosinophils Absolute 06/25/2022 0.2  0.0 - 0.5 K/uL Final   Basophils Relative 06/25/2022 1  % Final   Basophils Absolute 06/25/2022 0.1  0.0 - 0.1 K/uL Final   Immature Granulocytes 06/25/2022 0  % Final   Abs Immature Granulocytes 06/25/2022 0.02  0.00 - 0.07 K/uL Final   Sodium 06/25/2022 138  135 - 145 mmol/L Final   Potassium 06/25/2022 3.9  3.5 - 5.1 mmol/L Final   Chloride 06/25/2022 105  98 - 111 mmol/L Final   CO2 06/25/2022 22  22 - 32 mmol/L Final   Glucose, Bld 06/25/2022 99  70 - 99 mg/dL Final   BUN 06/25/2022 13  6 - 20 mg/dL Final   Creatinine, Ser 06/25/2022 0.60  0.44 - 1.00 mg/dL Final   Calcium 06/25/2022 8.9  8.9 - 10.3 mg/dL Final   Total Protein 06/25/2022 7.3  6.5 - 8.1 g/dL Final   Albumin 06/25/2022 4.3  3.5 - 5.0 g/dL Final   AST 06/25/2022 21  15 - 41 U/L Final   ALT 06/25/2022 20  0 - 44 U/L Final   Alkaline Phosphatase 06/25/2022 87  38 - 126 U/L Final   Total Bilirubin 06/25/2022 0.8  0.3 - 1.2 mg/dL Final   GFR, Estimated 06/25/2022 >60  >60 mL/min Final   Anion gap 06/25/2022 11  5 -  15 Final   Lipase 06/25/2022 26  11 - 51 U/L Final   Troponin I (High Sensitivity) 06/25/2022 2  <18 ng/L Final   Color, Urine 06/25/2022 YELLOW  YELLOW Final   APPearance 06/25/2022 HAZY (A)  CLEAR Final   Specific Gravity, Urine 06/25/2022 1.009  1.005 - 1.030 Final   pH 06/25/2022 7.0  5.0 - 8.0 Final   Glucose, UA 06/25/2022 NEGATIVE  NEGATIVE mg/dL Final   Hgb urine dipstick 06/25/2022 NEGATIVE  NEGATIVE Final   Bilirubin Urine 06/25/2022 NEGATIVE  NEGATIVE Final   Ketones, ur 06/25/2022 NEGATIVE  NEGATIVE mg/dL Final   Protein, ur 06/25/2022 NEGATIVE  NEGATIVE mg/dL Final   Nitrite 06/25/2022 NEGATIVE  NEGATIVE Final   Leukocytes,Ua 06/25/2022 NEGATIVE  NEGATIVE Final   TSH 06/25/2022 1.565  0.350 - 4.500 uIU/mL Final   Free T4 06/25/2022 0.88  0.61 - 1.12 ng/dL Final   T3, Free 06/25/2022 2.8  2.0 - 4.4 pg/mL Final      Commons side effects, risks, benefits, and alternatives for medications and treatment plan prescribed today were discussed, and the patient expressed understanding of the given instructions. Patient is instructed to call or message via MyChart if he/she has any questions or concerns regarding our treatment plan. No barriers to understanding were identified. We discussed Red Flag symptoms and signs in detail. Patient expressed understanding regarding what to do in case of urgent or emergency type symptoms.  Medication list was reconciled, printed and provided to the patient in AVS. Patient instructions and summary information was reviewed with the patient as documented in the AVS. This note was prepared with assistance of Dragon voice recognition software. Occasional wrong-word or sound-a-like substitutions may have occurred due to the inherent limitations of voice recognition software

## 2022-07-06 NOTE — Patient Instructions (Signed)
Please follow up if symptoms do not improve or as needed.    Lab Results  Component Value Date   CHOL 236 (H) 06/14/2022   CHOL 256 (H) 04/09/2021   CHOL 218 (H) 01/12/2021   Lab Results  Component Value Date   HDL 104 06/14/2022   HDL 68.00 04/09/2021   HDL 87 01/12/2021   Lab Results  Component Value Date   LDLCALC 107 (H) 06/14/2022   LDLCALC 163 (H) 04/09/2021   LDLCALC 107 (H) 01/12/2021   Lab Results  Component Value Date   TRIG 140 06/14/2022   TRIG 123.0 04/09/2021   TRIG 128 01/12/2021   Lab Results  Component Value Date   CHOLHDL 2.3 06/14/2022   CHOLHDL 4 04/09/2021   CHOLHDL 2.5 01/12/2021   Lab Results  Component Value Date   LDLDIRECT 95.0 09/03/2021

## 2022-07-18 ENCOUNTER — Telehealth: Payer: Self-pay

## 2022-07-18 NOTE — Telephone Encounter (Signed)
Patient called stating she wants her PremPro increased to the next dose.  She said she had hormone testing done which you will receive results on today or tomorrow.  She said it showed the levels were low.  She said after 4 years on this dose it needs to be increased.

## 2022-07-19 ENCOUNTER — Encounter: Payer: Self-pay | Admitting: Family Medicine

## 2022-07-19 MED ORDER — PREMPRO 0.625-2.5 MG PO TABS: 1.0000 | ORAL_TABLET | Freq: Every day | ORAL | 1 refills | Status: DC

## 2022-07-19 NOTE — Telephone Encounter (Signed)
Genia Del, MD  You12 minutes ago (3:38 PM)   Yes, can take 2 of the lower dosage until finished. Dr Pennie Banter with patient and informed her.

## 2022-07-19 NOTE — Telephone Encounter (Signed)
Genia Del, MD  You1 hour ago (12:24 PM)   Can prescribe Prempro higher dosage at 0.625/2.5 1 tab PO daily. Dr Pennie Banter with patient and informed her.  Rx sent at her request to mail order pharmacy.  Patient said she just received 90 days Rx for the Prempro 0.3/1.5mg  tab and asked if it would be okay to double up and take so not to waste that whole prescription?

## 2022-07-27 ENCOUNTER — Ambulatory Visit
Admission: RE | Admit: 2022-07-27 | Discharge: 2022-07-27 | Disposition: A | Discharge status: Home / Self Care | Payer: 59 | Source: Ambulatory Visit | Attending: Obstetrics & Gynecology | Admitting: Obstetrics & Gynecology

## 2022-07-27 ENCOUNTER — Other Ambulatory Visit: Payer: Self-pay | Admitting: Obstetrics & Gynecology

## 2022-07-27 DIAGNOSIS — R599 Enlarged lymph nodes, unspecified: Secondary | ICD-10-CM

## 2022-07-27 DIAGNOSIS — M79601 Pain in right arm: Secondary | ICD-10-CM

## 2022-08-03 ENCOUNTER — Ambulatory Visit: Payer: 59 | Admitting: Family Medicine

## 2022-08-07 ENCOUNTER — Encounter: Payer: Self-pay | Admitting: Family Medicine

## 2022-08-24 ENCOUNTER — Encounter: Payer: 59 | Admitting: Family Medicine

## 2022-08-25 ENCOUNTER — Telehealth: Payer: Self-pay | Admitting: Family Medicine

## 2022-08-25 ENCOUNTER — Ambulatory Visit (INDEPENDENT_AMBULATORY_CARE_PROVIDER_SITE_OTHER): Payer: 59 | Admitting: Family Medicine

## 2022-08-25 ENCOUNTER — Encounter: Payer: Self-pay | Admitting: Family Medicine

## 2022-08-25 VITALS — BP 142/88 | HR 65 | Temp 98.0°F | Resp 18 | Ht 64.0 in | Wt 150.2 lb

## 2022-08-25 DIAGNOSIS — N951 Menopausal and female climacteric states: Secondary | ICD-10-CM

## 2022-08-25 DIAGNOSIS — K5900 Constipation, unspecified: Secondary | ICD-10-CM

## 2022-08-25 DIAGNOSIS — I10 Essential (primary) hypertension: Secondary | ICD-10-CM

## 2022-08-25 MED ORDER — CHLORTHALIDONE 25 MG PO TABS: 25.0000 mg | ORAL_TABLET | Freq: Every day | ORAL | 0 refills | Status: DC

## 2022-08-25 NOTE — Telephone Encounter (Signed)
That is okay with me.  If Dr. Ruthine Dose approves please tell patient thank you for the time she allowed me to be her physician.  Ideally would also cancel June visit in that case

## 2022-08-25 NOTE — Telephone Encounter (Signed)
Patient requesting transfer from Dr.Hunter to [facility] . Please Advise .

## 2022-08-25 NOTE — Patient Instructions (Addendum)
It was very nice to see you today!  Continue diet/exercise.   Start chlorthalidone 1/2 tab.  Monitor blood pressures.    PLEASE NOTE:  If you had any lab tests please let us know if you have not heard back within a few days. You may see your results on MyChart before we have a chance to review them but we will give you a call once they are reviewed by [place]. If we ordered any referrals today, please let us know if you have not heard from their office within the next week.   Please try these tips to maintain a healthy lifestyle:  Eat most of your calories during the day when you are active. Eliminate processed foods including packaged sweets (pies, cakes, cookies), reduce intake of potatoes, white bread, white pasta, and white rice. Look for whole grain options, oat flour or almond flour.  Each meal should contain half fruits/vegetables, one quarter protein, and one quarter carbs (no bigger than a computer mouse).  Cut down on sweet beverages. This includes juice, soda, and sweet tea. Also watch fruit intake, though this is a healthier sweet option, it still contains natural sugar! Limit to 3 servings daily.  Drink at least 1 glass of water with each meal and aim for at least 8 glasses per day  Exercise at least 150 minutes every week.

## 2022-08-25 NOTE — Progress Notes (Signed)
Subjective:     Patient ID: Jill Price, female    DOB: 1965/10/27, 57 y.o.   MRN: [id number]  Chief Complaint  Patient presents with   Hypertension    Was at obgyn yesterday and blood pressure was elevated and told her she needed to be seen Discuss estrogen and high cholesterol Not fasting    HPI A lot going on for 14 months. Has seen mult providers and 1 ER visit.  Figured out was hormonal.  Polyps in sinuses-B12 low. A lot of other symptoms.   Went to [facility] and hormonal panel and told all off.  So upped dosage of prempro in April-feeling much better.   Blood pressure's were 110-120/70-80 and then 3/21, blood pressure 140/94 and elevated since 130-140/90.    Prempro for 4 years and increased the dose in April 15.  No headache(s)/dizziness/chest pain/shortness of breath/edema.wants blood pressure addressed Cholestrol trending upwards-some exercise, good diet.  No changes in lifestyle, but cholesterol continues to rise.[facility] for "just not feeling well".  Chest xray and EKG ok, labs ok.   Now having regular bowel movement's-taking senna tea intermitt.  Drinks plenty of water, fruits/veges. CRC screen UTD.   Seeing gyn - was told to follow up PCP for blood pressure/cholesterol.  Patient did a lot of research PTA  Health Maintenance Due  Topic Date Due   HIV Screening  Never done    Past Medical History:  Diagnosis Date   Depression    situational- never on meds    Frequent headaches    Migraines    Onychomycosis 05/30/2018    Past Surgical History:  Procedure Laterality Date   none       Current Outpatient Medications:    B Complex Vitamins (B COMPLEX PO), Take by mouth., Disp: , Rfl:    chlorthalidone (HYGROTON) 25 MG tablet, Take 1 tablet (25 mg total) by mouth daily., Disp: 30 tablet, Rfl: 0   estrogen, conjugated,-medroxyprogesterone (PREMPRO) 0.625-2.5 MG tablet, Take 1 tablet by mouth daily., Disp: 90 tablet, Rfl: 1   Multiple  Vitamin (MULTIVITAMIN PO), Take by mouth., Disp: , Rfl:    Probiotic Product (PROBIOTIC PO), Take by mouth., Disp: , Rfl:    valACYclovir (VALTREX) 1000 MG tablet, Take one tablet po daily 3-5 days with outbreak, then daily prn., Disp: 90 tablet, Rfl: 1   Vitamin D, Ergocalciferol, (DRISDOL) 1.25 MG (50000 UNIT) CAPS capsule, Take 50,000 Units by mouth once a week., Disp: , Rfl:   No Known Allergies ROS neg/noncontributory except as noted HPI/below      Objective:     BP (!) 142/88 (BP Location: Left Arm, Patient Position: Sitting, Cuff Size: Normal)   Pulse 65   Temp 98 F (36.7 C) (Temporal)   Resp 18   Ht 5\' 4"  (1.626 m)   Wt 150 lb 4 oz (68.2 kg)   LMP 04/14/2016 Comment: not sexually active  SpO2 99%   BMI 25.79 kg/m  Wt Readings from Last 3 Encounters:  08/25/22 150 lb 4 oz (68.2 kg)  07/06/22 150 lb (68 kg)  06/25/22 149 lb 14.6 oz (68 kg)    Physical Exam   Gen: WDWN NAD HEENT: NCAT, conjunctiva not injected, sclera nonicteric NECK:  supple, no thyromegaly, no nodes, no carotid bruits CARDIAC: RRR, S1S2+, no murmur. DP 2+B LUNGS: CTAB. No wheezes ABDOMEN:  BS+, soft, NTND, No HSM, no masses EXT:  no edema MSK: no gross abnormalities.  NEURO: A&O x3.  CN II-XII intact.  PSYCH: normal mood. Good eye contact  Reviewed labs  spent w/patient reviewing, listening, devising and discussing plan     Assessment & Plan:  Primary hypertension  Menopausal symptoms  Constipation, unspecified constipation type  Other orders -     Chlorthalidone; Take 1 tablet (25 mg total) by mouth daily.  Dispense: 30 tablet; Refill: 0   HTN-new diagnosis.  Will do chlorthalidone 25 mg-patient starting w/1/2 tab/day and monitor blood pressure's.  Discussed possibly losing K so if cramps, get more K OTC (available over the counter without a prescription)/diet.  Follow up 1 weeks Menopausal symptoms-better on increased dose of prempro-monitor for now Constipation-newer.  Do  senna tea nightly and see if helps.    Follow up 2 weeks.   Angelena Sole, MD

## 2022-08-26 ENCOUNTER — Encounter: Payer: Self-pay | Admitting: Family Medicine

## 2022-08-26 ENCOUNTER — Other Ambulatory Visit: Payer: Self-pay | Admitting: *Deleted

## 2022-08-26 DIAGNOSIS — R899 Unspecified abnormal finding in specimens from other organs, systems and tissues: Secondary | ICD-10-CM

## 2022-08-29 ENCOUNTER — Encounter: Payer: Self-pay | Admitting: Family Medicine

## 2022-08-30 ENCOUNTER — Other Ambulatory Visit (INDEPENDENT_AMBULATORY_CARE_PROVIDER_SITE_OTHER): Payer: 59

## 2022-08-30 DIAGNOSIS — R899 Unspecified abnormal finding in specimens from other organs, systems and tissues: Secondary | ICD-10-CM

## 2022-08-30 LAB — COMPREHENSIVE METABOLIC PANEL
ALT: 15 U/L (ref 0–35)
AST: 20 U/L (ref 0–37)
Albumin: 4.1 g/dL (ref 3.5–5.2)
Alkaline Phosphatase: 92 U/L (ref 39–117)
BUN: 14 mg/dL (ref 6–23)
CO2: 26 mEq/L (ref 19–32)
Calcium: 9.3 mg/dL (ref 8.4–10.5)
Chloride: 100 mEq/L (ref 96–112)
Creatinine, Ser: 0.76 mg/dL (ref 0.40–1.20)
GFR: 86.28 mL/min (ref >60.00)
Glucose, Bld: 106 mg/dL — ABNORMAL HIGH (ref 70–99)
Potassium: 3.8 mEq/L (ref 3.5–5.1)
Sodium: 136 mEq/L (ref 135–145)
Total Bilirubin: 0.8 mg/dL (ref 0.2–1.2)
Total Protein: 6.9 g/dL (ref 6.0–8.3)

## 2022-08-31 ENCOUNTER — Encounter: Payer: Self-pay | Admitting: Family Medicine

## 2022-08-31 LAB — HEPATITIS C ANTIBODY: Hepatitis C Ab: NONREACTIVE

## 2022-08-31 LAB — HEPATITIS B SURFACE ANTIGEN: Hepatitis B Surface Ag: NONREACTIVE

## 2022-08-31 LAB — HEPATITIS B SURFACE ANTIBODY, QUANTITATIVE: Hep B S AB Quant (Post): <5 m[IU]/mL — ABNORMAL LOW (ref >=10)

## 2022-08-31 NOTE — Progress Notes (Signed)
No hepatitis, however, not immune to hepatitis B-we can immunize when here for appointment. I did not order A1C-will do when here for appointment

## 2022-09-01 NOTE — Telephone Encounter (Signed)
Please call pt back with lab results 

## 2022-09-12 ENCOUNTER — Encounter: Payer: Self-pay | Admitting: Obstetrics & Gynecology

## 2022-09-16 ENCOUNTER — Other Ambulatory Visit: Payer: Self-pay | Admitting: Family Medicine

## 2022-09-19 ENCOUNTER — Ambulatory Visit: Payer: 59 | Admitting: Family Medicine

## 2022-09-21 ENCOUNTER — Telehealth: Payer: Self-pay | Admitting: Family Medicine

## 2022-09-21 ENCOUNTER — Encounter: Payer: Self-pay | Admitting: Family Medicine

## 2022-09-21 ENCOUNTER — Ambulatory Visit (INDEPENDENT_AMBULATORY_CARE_PROVIDER_SITE_OTHER): Payer: 59 | Admitting: Family Medicine

## 2022-09-21 VITALS — BP 112/82 | HR 62 | Temp 97.9°F | Resp 16 | Ht 64.0 in | Wt 152.0 lb

## 2022-09-21 DIAGNOSIS — E7800 Pure hypercholesterolemia, unspecified: Secondary | ICD-10-CM

## 2022-09-21 DIAGNOSIS — E538 Deficiency of other specified B group vitamins: Secondary | ICD-10-CM

## 2022-09-21 DIAGNOSIS — I10 Essential (primary) hypertension: Secondary | ICD-10-CM | POA: Diagnosis not present

## 2022-09-21 DIAGNOSIS — K5900 Constipation, unspecified: Secondary | ICD-10-CM | POA: Diagnosis not present

## 2022-09-21 LAB — URINALYSIS, ROUTINE W REFLEX MICROSCOPIC
Bilirubin Urine: NEGATIVE
Hgb urine dipstick: NEGATIVE
Ketones, ur: NEGATIVE
Leukocytes,Ua: NEGATIVE
Nitrite: NEGATIVE
RBC / HPF: NONE SEEN (ref 0–?)
Specific Gravity, Urine: 1.01 (ref 1.000–1.030)
Total Protein, Urine: NEGATIVE
Urine Glucose: NEGATIVE
Urobilinogen, UA: 0.2 (ref 0.0–1.0)
pH: 7 (ref 5.0–8.0)

## 2022-09-21 LAB — COMPREHENSIVE METABOLIC PANEL
ALT: 16 U/L (ref 0–35)
AST: 21 U/L (ref 0–37)
Albumin: 4.2 g/dL (ref 3.5–5.2)
Alkaline Phosphatase: 81 U/L (ref 39–117)
BUN: 12 mg/dL (ref 6–23)
CO2: 28 mEq/L (ref 19–32)
Calcium: 9.4 mg/dL (ref 8.4–10.5)
Chloride: 100 mEq/L (ref 96–112)
Creatinine, Ser: 0.74 mg/dL (ref 0.40–1.20)
GFR: 89.05 mL/min (ref >60.00)
Glucose, Bld: 92 mg/dL (ref 70–99)
Potassium: 4.1 mEq/L (ref 3.5–5.1)
Sodium: 138 mEq/L (ref 135–145)
Total Bilirubin: 0.8 mg/dL (ref 0.2–1.2)
Total Protein: 7.3 g/dL (ref 6.0–8.3)

## 2022-09-21 LAB — MICROALBUMIN / CREATININE URINE RATIO
Creatinine,U: 24.9 mg/dL
Microalb Creat Ratio: 2.8 mg/g (ref 0.0–30.0)
Microalb, Ur: <0.7 mg/dL (ref 0.0–1.9)

## 2022-09-21 LAB — VITAMIN B12: Vitamin B-12: 642 pg/mL (ref 211–911)

## 2022-09-21 LAB — HEMOGLOBIN A1C: Hgb A1c MFr Bld: 5.7 % (ref 4.6–6.5)

## 2022-09-21 LAB — MAGNESIUM: Magnesium: 2 mg/dL (ref 1.5–2.5)

## 2022-09-21 NOTE — Patient Instructions (Signed)
It was very nice to see you today!  Increase the chlorthalidone to whole tab.   get X-ray/labs at [facility].  [address]  hours 8=M-F 8:30-5.  closed 12:30-1 lunch    PLEASE NOTE:  If you had any lab tests please let us know if you have not heard back within a few days. You may see your results on MyChart before we have a chance to review them but we will give you a call once they are reviewed by [place]. If we ordered any referrals today, please let us know if you have not heard from their office within the next week.   Please try these tips to maintain a healthy lifestyle:  Eat most of your calories during the day when you are active. Eliminate processed foods including packaged sweets (pies, cakes, cookies), reduce intake of potatoes, white bread, white pasta, and white rice. Look for whole grain options, oat flour or almond flour.  Each meal should contain half fruits/vegetables, one quarter protein, and one quarter carbs (no bigger than a computer mouse).  Cut down on sweet beverages. This includes juice, soda, and sweet tea. Also watch fruit intake, though this is a healthier sweet option, it still contains natural sugar! Limit to 3 servings daily.  Drink at least 1 glass of water with each meal and aim for at least 8 glasses per day  Exercise at least 150 minutes every week.

## 2022-09-21 NOTE — Progress Notes (Signed)
Subjective:     Patient ID: Jill Price, female    DOB: [birth year], 57 y.o.   MRN: [id number]  Chief Complaint  Patient presents with   Transfer of Care    Transfer of Care Follow-up on HTN Not fasting     HPI Toc-pt brought timeline of events to better understand process  HTN-pt on chlorthalidone 1/2 tab which doesn't cut evenly.  Concerned about bp sudden onset.  Bp's were 110's/70's until 03/18/2022- even 06/14/22 was 112/70, then 3/21 140/94 and up since then.   Bp's running 106-139/74-80's.  Pt very neurotic and then goes and gets concerned about etiology, reasons, why the changes-just wants to understand process-very attuned to self.  When starts walking, has to get deep breath, but then fine-no cp.   Taking otc K bid and foods.   2.  2019-menopause.  On prempro 0.3/1.5 and vitamin D 50k/wk.  Doing fine till 2023.  Polyps in sinuses-resolved   3.  Vitamin B12 low and supplements and better-hasn't checked levels recently 4.  Fingernails change color, feet red.  Agitated at hs, face flushing during facials.  [facility]ent [facility] 07[facility]/22 and saw gyn so prempro increased and symptoms abated.    Doesn't understand why sudden onset of htn-healthy diet/exercises, etc.   Now constipation this year-drinks senna tea nightly now. Cologard 02/2021 Cholesterol increasing K and Na low end of normal.   Prempro?  Health Maintenance Due  Topic Date Due   HIV Screening  Never done    Past Medical History:  Diagnosis Date   Depression    situational- never on meds    Frequent headaches    Migraines    Onychomycosis 05/30/2018    Past Surgical History:  Procedure Laterality Date   none       Current Outpatient Medications:    B Complex Vitamins (B COMPLEX PO), Take by mouth., Disp: , Rfl:    chlorthalidone (HYGROTON) 25 MG tablet, TAKE 1 TABLET (25 MG TOTAL) BY MOUTH DAILY., Disp: 90 tablet, Rfl: 1   estrogen, conjugated,-medroxyprogesterone (PREMPRO) 0.625-2.5 MG tablet,  Take 1 tablet by mouth daily., Disp: 90 tablet, Rfl: 1   Multiple Vitamin (MULTIVITAMIN PO), Take by mouth., Disp: , Rfl:    Probiotic Product (PROBIOTIC PO), Take by mouth., Disp: , Rfl:    valACYclovir (VALTREX) 1000 MG tablet, Take one tablet po daily 3-5 days with outbreak, then daily prn., Disp: 90 tablet, Rfl: 1   Vitamin D, Ergocalciferol, (DRISDOL) 1.25 MG (50000 UNIT) CAPS capsule, Take 50,000 Units by mouth once a week., Disp: , Rfl:   No Known Allergies ROS neg/noncontributory except as noted HPI/below      Objective:     BP 112/82   Pulse 62   Temp 97.9 F (36.6 C) (Temporal)   Resp 16   Ht 5\' 4"  (1.626 m)   Wt 152 lb (68.9 kg)   LMP 04/14/2016 Comment: not sexually active  SpO2 100%   BMI 26.09 kg/m  Wt Readings from Last 3 Encounters:  09/21/22 152 lb (68.9 kg)  08/25/22 150 lb 4 oz (68.2 kg)  07/06/22 150 lb (68 kg)    Physical Exam   Gen: WDWN NAD HEENT: NCAT, conjunctiva not injected, sclera nonicteric NECK:  supple, no thyromegaly, no nodes, no carotid bruits CARDIAC: RRR, S1S2+, no murmur. DP 2+B LUNGS: CTAB. No wheezes ABDOMEN:  BS+, soft, NTND, No HSM, no masses EXT:  no edema MSK: no gross abnormalities.  NEURO: A&O x3.  CN II-XII intact.  PSYCH: normal mood. Good eye contact  Reviewed timeline of events pt brought w/her and labs.    Assessment & Plan:  Primary hypertension -     Comprehensive metabolic panel -     Hemoglobin A1c -     Microalbumin / creatinine urine ratio -     Urinalysis, Routine w reflex microscopic -     Magnesium  B12 deficiency -     Vitamin B12  Constipation, unspecified constipation type -     DG Abd 2 Views; Future  Elevated cholesterol  1.  Hypertension-newer diagnosis.  Patient has been doing a lot of research.  She would like to know if there is an explanation for the sudden onset of hypertension.  Advised that I do not see any symptoms of other etiologies.  Blood pressures are severely elevated.   Could be the change in hormones, aging process, other.  She does have a healthy lifestyle.  Continue.  Will check labs due to medications and looking for other simple etiologies.  Will increase chlorthalidone to 25 mg daily to try to get better control of blood pressure.  She is taking a lot of natural potassium supplements/food.  Follow-up in 1 month and will repeat BMP.  Offered cardiology referral, she declined for now as I do not have great concerns. 2.  History of B12 deficiency.  Patient is taking supplements with B12 and methyl folate in them.  Will check B12 levels. 3.  Constipation-newer problem this year.  No pain.  Negative Cologuard a year and a half ago.  Already doing a lot of fiber and water.  Using senna tea every few nights.  Will check abdominal films to look at extent of constipation, obstructions, etc. 4.  Elevated cholesterol-patient has a good diet.  Pattern is excellent.  Advised that the HDL increase may be from the hormones.  LDL is 107 which is excellent.  Will repeat in 1 month  Return in about 4 weeks (around 10/19/2022) for HTN.  Angelena Sole, MD

## 2022-09-21 NOTE — Telephone Encounter (Signed)
Patient scheduled for next f/u on July 25th . Pt would like to know if fasting labs will be required and if so if labs can be placed so that she can have them done a day  prior to that appointment .  Please Advise .

## 2022-09-21 NOTE — Telephone Encounter (Signed)
Please see message below and advise.

## 2022-09-21 NOTE — Progress Notes (Signed)
Labs look great. A1c just into the range of "prediabetes".  Continue working on diet/exercise.  This could be her normal that is been going on a long time, or a bit of a new thing (which we see with the aging process).

## 2022-09-22 ENCOUNTER — Encounter: Payer: Self-pay | Admitting: Family Medicine

## 2022-09-23 ENCOUNTER — Ambulatory Visit (INDEPENDENT_AMBULATORY_CARE_PROVIDER_SITE_OTHER): Payer: 59 | Admitting: Obstetrics & Gynecology

## 2022-09-23 ENCOUNTER — Encounter: Payer: Self-pay | Admitting: Family Medicine

## 2022-09-23 ENCOUNTER — Encounter: Payer: Self-pay | Admitting: Obstetrics & Gynecology

## 2022-09-23 VITALS — BP 118/72 | HR 81 | Temp 97.6°F

## 2022-09-23 DIAGNOSIS — K5900 Constipation, unspecified: Secondary | ICD-10-CM | POA: Diagnosis not present

## 2022-09-23 DIAGNOSIS — N951 Menopausal and female climacteric states: Secondary | ICD-10-CM | POA: Diagnosis not present

## 2022-09-23 DIAGNOSIS — Z79890 Hormone replacement therapy (postmenopausal): Secondary | ICD-10-CM | POA: Diagnosis not present

## 2022-09-23 DIAGNOSIS — R102 Pelvic and perineal pain: Secondary | ICD-10-CM

## 2022-09-23 LAB — URINALYSIS, COMPLETE W/RFL CULTURE
Bacteria, UA: NONE SEEN /HPF
Bilirubin Urine: NEGATIVE
Glucose, UA: NEGATIVE
Hgb urine dipstick: NEGATIVE
Hyaline Cast: NONE SEEN /LPF
Ketones, ur: NEGATIVE
Leukocyte Esterase: NEGATIVE
Nitrites, Initial: NEGATIVE
Protein, ur: NEGATIVE
RBC / HPF: NONE SEEN /HPF (ref 0–2)
Specific Gravity, Urine: 1.015 (ref 1.001–1.035)
Squamous Epithelial / HPF: NONE SEEN /HPF (ref <=5)
WBC, UA: NONE SEEN /HPF (ref 0–5)
pH: 7 (ref 5.0–8.0)

## 2022-09-23 LAB — NO CULTURE INDICATED

## 2022-09-23 NOTE — Telephone Encounter (Signed)
Will route to provider for final review and close.  

## 2022-09-23 NOTE — Addendum Note (Signed)
Addended by: Angelena Sole on: 09/23/2022 09:08 AM   Modules accepted: Orders

## 2022-09-23 NOTE — Progress Notes (Signed)
    Jill Price Jul 25, 1965 [id number]        57 y.o.  G1P1001   RP: Pelvic pressure  HPI: Patient c/o pelvic pressure.  No UTI Sx otherwise.  Constipation x 06/2022.  Managing with diet.  Menopausal, well on Prempro.  No PMB.   OB History  Gravida Para Term Preterm AB Living  1 1 1     1   SAB IAB Ectopic Multiple Live Births               # Outcome Date GA Lbr Len/2nd Weight Sex Delivery Anes PTL Lv  1 Term             Past medical history,surgical history, problem list, medications, allergies, family history and social history were all reviewed and documented in the EPIC chart.   Directed ROS with pertinent positives and negatives documented in the history of present illness/assessment and plan.  Exam:  Vitals:   09/23/22 0955  BP: 118/72  Pulse: 81  Temp: 97.6 F (36.4 C)  TempSrc: Oral  SpO2: 98%   General appearance:  Normal  Abdomen: Normal, soft, NT.  Gynecologic exam: Vulva normal.  Bimanual exam: RV uterus, normal volume, mobile, NT.  No adnexal mass, NT.  No blood, normal secretions.  U/A completely Negative   Assessment/Plan:  57 y.o. G1P1001   1. Pelvic pressure in female U/A completely negative.  Reassured. Pelvic exam normal.  Pelvic pressure probably associated with constipation. - Urinalysis,Complete w/RFL Culture  2. Constipation, unspecified constipation type Counseling on management of constipation.  3. Menopausal syndrome on hormone replacement therapy  Well on Prempro.  No PMB.  Genia Del MD, 10:06 AM 09/23/2022

## 2022-09-25 ENCOUNTER — Encounter: Payer: Self-pay | Admitting: Obstetrics & Gynecology

## 2022-09-25 DIAGNOSIS — Z79890 Hormone replacement therapy (postmenopausal): Secondary | ICD-10-CM

## 2022-09-26 MED ORDER — PREMPRO 0.45-1.5 MG PO TABS
1.0000 | ORAL_TABLET | Freq: Every day | ORAL | 0 refills | Status: DC
Start: 2022-09-26 — End: 2022-11-15

## 2022-09-26 NOTE — Telephone Encounter (Signed)
Last AEX 01/12/2022--recall placed 01/2023. Last mammo 07/27/2022-neg birads 1 Please advise.

## 2022-09-26 NOTE — Telephone Encounter (Signed)
Per mL: "Yes, agree with 0.45/1.5 PremPro dosage. Dr. Elbert Ewings"   Rx sent and pt notified. Will route to provider for final review and close.

## 2022-09-28 NOTE — Addendum Note (Signed)
Addended by: Angelena Sole on: 09/28/2022 07:23 AM   Modules accepted: Orders

## 2022-10-02 ENCOUNTER — Other Ambulatory Visit: Payer: Self-pay | Admitting: Family Medicine

## 2022-10-10 ENCOUNTER — Encounter: Payer: Self-pay | Admitting: Obstetrics & Gynecology

## 2022-10-10 ENCOUNTER — Encounter: Payer: Self-pay | Admitting: Family Medicine

## 2022-10-26 ENCOUNTER — Other Ambulatory Visit: Payer: 59

## 2022-10-26 ENCOUNTER — Ambulatory Visit: Payer: 59 | Admitting: Family Medicine

## 2022-10-26 DIAGNOSIS — E7800 Pure hypercholesterolemia, unspecified: Secondary | ICD-10-CM

## 2022-10-26 DIAGNOSIS — I10 Essential (primary) hypertension: Secondary | ICD-10-CM

## 2022-10-26 LAB — BASIC METABOLIC PANEL
BUN: 12 mg/dL (ref 6–23)
CO2: 25 mEq/L (ref 19–32)
Calcium: 8.7 mg/dL (ref 8.4–10.5)
Chloride: 104 mEq/L (ref 96–112)
Creatinine, Ser: 0.73 mg/dL (ref 0.40–1.20)
GFR: 90.45 mL/min (ref >60.00)
Glucose, Bld: 92 mg/dL (ref 70–99)
Potassium: 3.8 mEq/L (ref 3.5–5.1)
Sodium: 137 mEq/L (ref 135–145)

## 2022-10-26 LAB — LIPID PANEL
Cholesterol: 183 mg/dL (ref 0–200)
HDL: 72 mg/dL (ref >39.00)
NonHDL: 111.16
Total CHOL/HDL Ratio: 3
Triglycerides: 221 mg/dL — ABNORMAL HIGH (ref 0.0–149.0)
VLDL: 44.2 mg/dL — ABNORMAL HIGH (ref 0.0–40.0)

## 2022-10-26 LAB — LDL CHOLESTEROL, DIRECT: Direct LDL: 70 mg/dL

## 2022-10-27 ENCOUNTER — Encounter: Payer: Self-pay | Admitting: Family Medicine

## 2022-10-27 ENCOUNTER — Ambulatory Visit: Payer: 59 | Admitting: Family Medicine

## 2022-10-27 LAB — APOLIPOPROTEIN B: Apolipoprotein B: 64 mg/dL (ref <90)

## 2022-10-27 NOTE — Progress Notes (Signed)
Lp(a) still pending.   Cholesterol is much better.  No need to repeat any time soon.  Yes triglycerides were a bit elevated so just knowing how your body reacts to certain foods.  I am not overly concerned but continue to work on it.

## 2022-10-31 ENCOUNTER — Telehealth: Payer: 59 | Admitting: Physician Assistant

## 2022-10-31 DIAGNOSIS — B9689 Other specified bacterial agents as the cause of diseases classified elsewhere: Secondary | ICD-10-CM

## 2022-10-31 DIAGNOSIS — J329 Chronic sinusitis, unspecified: Secondary | ICD-10-CM | POA: Diagnosis not present

## 2022-10-31 MED ORDER — AMOXICILLIN-POT CLAVULANATE 875-125 MG PO TABS
1.0000 | ORAL_TABLET | Freq: Two times a day (BID) | ORAL | 0 refills | Status: DC
Start: 2022-10-31 — End: 2023-01-17

## 2022-10-31 NOTE — Patient Instructions (Signed)
Benjamine Sprague, thank you for joining Margaretann Loveless, PA-C for today's virtual visit.  While this provider is not your primary care provider (PCP), if your PCP is located in our provider database this encounter information will be shared with them immediately following your visit.   A [facility] MyChart account gives you access to today's visit and all your visits, tests, and labs performed at [facility] " click here if you don't have a [facility] MyChart account or go to [email or web address]  Consent: (Patient) Intisar Landin provided verbal consent for this virtual visit at the beginning of the encounter.  Current Medications:  Current Outpatient Medications:    amoxicillin-clavulanate (AUGMENTIN) 875-125 MG tablet, Take 1 tablet by mouth 2 (two) times daily., Disp: 20 tablet, Rfl: 0   B Complex Vitamins (B COMPLEX PO), Take by mouth., Disp: , Rfl:    chlorthalidone (HYGROTON) 25 MG tablet, TAKE 1 TABLET (25 MG TOTAL) BY MOUTH DAILY., Disp: 90 tablet, Rfl: 1   estrogen, conjugated,-medroxyprogesterone (PREMPRO) 0.45-1.5 MG tablet, Take 1 tablet by mouth daily., Disp: 90 tablet, Rfl: 0   Multiple Vitamin (MULTIVITAMIN PO), Take by mouth., Disp: , Rfl:    Probiotic Product (PROBIOTIC PO), Take by mouth., Disp: , Rfl:    valACYclovir (VALTREX) 1000 MG tablet, Take one tablet po daily 3-5 days with outbreak, then daily prn., Disp: 90 tablet, Rfl: 1   Vitamin D, Ergocalciferol, (DRISDOL) 1.25 MG (50000 UNIT) CAPS capsule, Take 50,000 Units by mouth once a week., Disp: , Rfl:    Medications ordered in this encounter:  Meds ordered this encounter  Medications   amoxicillin-clavulanate (AUGMENTIN) 875-125 MG tablet    Sig: Take 1 tablet by mouth 2 (two) times daily.    Dispense:  20 tablet    Refill:  0    Order Specific Question:   Supervising Provider    Answer:   Merrilee Jansky [id number]     *If you need refills on other medications prior to your  next appointment, please contact your pharmacy*  Follow-Up: Call back or seek an in-person evaluation if the symptoms worsen or if the condition fails to improve as anticipated.  [facility] [phone]  Other Instructions Sinus Infection, Adult A sinus infection, also called sinusitis, is inflammation of your sinuses. Sinuses are hollow spaces in the bones around your face. Your sinuses are located: Around your eyes. In the middle of your forehead. Behind your nose. In your cheekbones. Mucus normally drains out of your sinuses. When your nasal tissues become inflamed or swollen, mucus can become trapped or blocked. This allows bacteria, viruses, and fungi to grow, which leads to infection. Most infections of the sinuses are caused by a virus. A sinus infection can develop quickly. It can last for up to 4 weeks (acute) or for more than 12 weeks (chronic). A sinus infection often develops after a cold. What are the causes? This condition is caused by anything that creates swelling in the sinuses or stops mucus from draining. This includes: Allergies. Asthma. Infection from bacteria or viruses. Deformities or blockages in your nose or sinuses. Abnormal growths in the nose (nasal polyps). Pollutants, such as chemicals or irritants in the air. Infection from fungi. This is rare. What increases the risk? You are more likely to develop this condition if you: Have a weak body defense system (immune system). Do a lot of swimming or diving. Overuse nasal sprays. Smoke. What are the signs or symptoms? The  main symptoms of this condition are pain and a feeling of pressure around the affected sinuses. Other symptoms include: Stuffy nose or congestion that makes it difficult to breathe through your nose. Thick yellow or greenish drainage from your nose. Tenderness, swelling, and warmth over the affected sinuses. A cough that may get worse at night. Decreased sense of smell and  taste. Extra mucus that collects in the throat or the back of the nose (postnasal drip) causing a sore throat or bad breath. Tiredness (fatigue). Fever. How is this diagnosed? This condition is diagnosed based on: Your symptoms. Your medical history. A physical exam. Tests to find out if your condition is acute or chronic. This may include: Checking your nose for nasal polyps. Viewing your sinuses using a device that has a light (endoscope). Testing for allergies or bacteria. Imaging tests, such as an MRI or CT scan. In rare cases, a bone biopsy may be done to rule out more serious types of fungal sinus disease. How is this treated? Treatment for a sinus infection depends on the cause and whether your condition is chronic or acute. If caused by a virus, your symptoms should go away on their own within 10 days. You may be given medicines to relieve symptoms. They include: Medicines that shrink swollen nasal passages (decongestants). A spray that eases inflammation of the nostrils (topical intranasal corticosteroids). Rinses that help get rid of thick mucus in your nose (nasal saline washes). Medicines that treat allergies (antihistamines). Over-the-counter pain relievers. If caused by bacteria, your health care provider may recommend waiting to see if your symptoms improve. Most bacterial infections will get better without antibiotic medicine. You may be given antibiotics if you have: A severe infection. A weak immune system. If caused by narrow nasal passages or nasal polyps, surgery may be needed. Follow these instructions at home: Medicines Take, use, or apply over-the-counter and prescription medicines only as told by your health care provider. These may include nasal sprays. If you were prescribed an antibiotic medicine, take it as told by your health care provider. Do not stop taking the antibiotic even if you start to feel better. Hydrate and humidify  Drink enough fluid to  keep your urine pale yellow. Staying hydrated will help to thin your mucus. Use a cool mist humidifier to keep the humidity level in your home above 50%. Inhale steam for 10-15 minutes, 3-4 times a day, or as told by your health care provider. You can do this in the bathroom while a hot shower is running. Limit your exposure to cool or dry air. Rest Rest as much as possible. Sleep with your head raised (elevated). Make sure you get enough sleep each night. General instructions  Apply a warm, moist washcloth to your face 3-4 times a day or as told by your health care provider. This will help with discomfort. Use nasal saline washes as often as told by your health care provider. Wash your hands often with soap and water to reduce your exposure to germs. If soap and water are not available, use hand sanitizer. Do not smoke. Avoid being around people who are smoking (secondhand smoke). Keep all follow-up visits. This is important. Contact a health care provider if: You have a fever. Your symptoms get worse. Your symptoms do not improve within 10 days. Get help right away if: You have a severe headache. You have persistent vomiting. You have severe pain or swelling around your face or eyes. You have vision problems. You develop confusion.  Your neck is stiff. You have trouble breathing. These symptoms may be an emergency. Get help right away. Call 911. Do not wait to see if the symptoms will go away. Do not drive yourself to the hospital. Summary A sinus infection is soreness and inflammation of your sinuses. Sinuses are hollow spaces in the bones around your face. This condition is caused by nasal tissues that become inflamed or swollen. The swelling traps or blocks the flow of mucus. This allows bacteria, viruses, and fungi to grow, which leads to infection. If you were prescribed an antibiotic medicine, take it as told by your health care provider. Do not stop taking the antibiotic even  if you start to feel better. Keep all follow-up visits. This is important. This information is not intended to replace advice given to you by your health care provider. Make sure you discuss any questions you have with your health care provider. Document Revised: 02/23/2021 Document Reviewed: 02/23/2021 Elsevier Patient Education  2024 Elsevier Inc.    If you have been instructed to have an in-person evaluation today at a local Urgent Care facility, please use the link below. It will take you to a list of all of our available [facility], including address, phone number and hours of operation. Please do not delay care.  [facility]  If you or a family member do not have a primary care provider, use the link below to schedule a visit and establish care. When you choose a [facility] primary care physician or advanced practice provider, you gain a long-term partner in health. Find a Primary Care Provider  Learn more about [facility]'s in-office and virtual care options:  - Get Care Now

## 2022-10-31 NOTE — Progress Notes (Signed)
Virtual Visit Consent   Jill Price, you are scheduled for a virtual visit with a [facility] provider today. Just as with appointments in the office, your consent must be obtained to participate. Your consent will be active for this visit and any virtual visit you may have with one of our providers in the next 365 days. If you have a MyChart account, a copy of this consent can be sent to you electronically.  As this is a virtual visit, video technology does not allow for your provider to perform a traditional examination. This may limit your provider's ability to fully assess your condition. If your provider identifies any concerns that need to be evaluated in person or the need to arrange testing (such as labs, EKG, etc.), we will make arrangements to do so. Although advances in technology are sophisticated, we cannot ensure that it will always work on either your end or our end. If the connection with a video visit is poor, the visit may have to be switched to a telephone visit. With either a video or telephone visit, we are not always able to ensure that we have a secure connection.  By engaging in this virtual visit, you consent to the provision of healthcare and authorize for your insurance to be billed (if applicable) for the services provided during this visit. Depending on your insurance coverage, you may receive a charge related to this service.  I need to obtain your verbal consent now. Are you willing to proceed with your visit today? Jill Price has provided verbal consent on 10/31/2022 for a virtual visit (video or telephone). Jill Loveless, PA-C  Date: 10/31/2022 3:34 PM  Virtual Visit via Video Note   I, Jill Price, connected with  Jill Price  ([id number], December 03, 1965) on 10/31/22 at  3:30 PM EDT by a video-enabled telemedicine application and verified that I am speaking with the correct person using two identifiers.  Location: Patient:  Virtual Visit Location Patient: Home Provider: Virtual Visit Location Provider: Home Office   I discussed the limitations of evaluation and management by telemedicine and the availability of in person appointments. The patient expressed understanding and agreed to proceed.    History of Present Illness: Jill Price is a 57 y.o. who identifies as a female who was assigned female at birth, and is being seen today for sinus infection.  HPI: Sinusitis This is a new problem. The current episode started 1 to 4 weeks ago. The problem has been gradually worsening since onset. There has been no fever. The pain is moderate. Associated symptoms include congestion and sinus pressure. Pertinent negatives include no chills, coughing, ear pain, headaches, hoarse voice or sore throat. Past treatments include nasal decongestants (nyquil, afrin). The treatment provided no relief.    Problems:  Patient Active Problem List   Diagnosis Date Noted   Primary hypertension 08/25/2022   B12 deficiency 09/03/2021   Hormone replacement therapy (HRT) 09/03/2018   Weight gain 02/12/2018   Vitamin D deficiency 08/03/2017   Bilateral hand pain 07/13/2017   Menopausal symptoms 07/13/2017    Allergies: No Known Allergies Medications:  Current Outpatient Medications:    amoxicillin-clavulanate (AUGMENTIN) 875-125 MG tablet, Take 1 tablet by mouth 2 (two) times daily., Disp: 20 tablet, Rfl: 0   B Complex Vitamins (B COMPLEX PO), Take by mouth., Disp: , Rfl:    chlorthalidone (HYGROTON) 25 MG tablet, TAKE 1 TABLET (25 MG TOTAL) BY MOUTH DAILY., Disp: 90 tablet, Rfl: 1  estrogen, conjugated,-medroxyprogesterone (PREMPRO) 0.45-1.5 MG tablet, Take 1 tablet by mouth daily., Disp: 90 tablet, Rfl: 0   Multiple Vitamin (MULTIVITAMIN PO), Take by mouth., Disp: , Rfl:    Probiotic Product (PROBIOTIC PO), Take by mouth., Disp: , Rfl:    valACYclovir (VALTREX) 1000 MG tablet, Take one tablet po daily 3-5 days with  outbreak, then daily prn., Disp: 90 tablet, Rfl: 1   Vitamin D, Ergocalciferol, (DRISDOL) 1.25 MG (50000 UNIT) CAPS capsule, Take 50,000 Units by mouth once a week., Disp: , Rfl:   Observations/Objective: Patient is well-developed, well-nourished in no acute distress.  Resting comfortably at home.  Head is normocephalic, atraumatic.  No labored breathing.  Speech is clear and coherent with logical content.  Patient is alert and oriented at baseline.    Assessment and Plan: 1. Bacterial sinusitis - amoxicillin-clavulanate (AUGMENTIN) 875-125 MG tablet; Take 1 tablet by mouth 2 (two) times daily.  Dispense: 20 tablet; Refill: 0  - Worsening symptoms that have not responded to OTC medications.  - Will give Augmentin - Continue allergy medications.  - Steam and humidifier can help - Stay well hydrated and get plenty of rest.  - Seek in person evaluation if no symptom improvement or if symptoms worsen   Follow Up Instructions: I discussed the assessment and treatment plan with the patient. The patient was provided an opportunity to ask questions and all were answered. The patient agreed with the plan and demonstrated an understanding of the instructions.  A copy of instructions were sent to the patient via MyChart unless otherwise noted below.    The patient was advised to call back or seek an in-person evaluation if the symptoms worsen or if the condition fails to improve as anticipated.  Time:  I spent 8 minutes with the patient via telehealth technology discussing the above problems/concerns.    Jill Loveless, PA-C

## 2022-11-08 NOTE — Progress Notes (Signed)
   Subjective:     Patient ID: Jill Price, female    DOB: 1965/06/30, 57 y.o.   MRN: [id number]  No chief complaint on file.   HPI *** - ***  *** - ***  *** - ***   Health Maintenance Due  Topic Date Due   HIV Screening  Never done   Zoster Vaccines- Shingrix (1 of 2) Never done   INFLUENZA VACCINE  11/03/2022    Past Medical History:  Diagnosis Date   Depression    situational- never on meds    Frequent headaches    Migraines    Onychomycosis 05/30/2018    Past Surgical History:  Procedure Laterality Date   none       Current Outpatient Medications:    amoxicillin-clavulanate (AUGMENTIN) 875-125 MG tablet, Take 1 tablet by mouth 2 (two) times daily., Disp: 20 tablet, Rfl: 0   B Complex Vitamins (B COMPLEX PO), Take by mouth., Disp: , Rfl:    chlorthalidone (HYGROTON) 25 MG tablet, TAKE 1 TABLET (25 MG TOTAL) BY MOUTH DAILY., Disp: 90 tablet, Rfl: 1   estrogen, conjugated,-medroxyprogesterone (PREMPRO) 0.45-1.5 MG tablet, Take 1 tablet by mouth daily., Disp: 90 tablet, Rfl: 0   Multiple Vitamin (MULTIVITAMIN PO), Take by mouth., Disp: , Rfl:    Probiotic Product (PROBIOTIC PO), Take by mouth., Disp: , Rfl:    valACYclovir (VALTREX) 1000 MG tablet, Take one tablet po daily 3-5 days with outbreak, then daily prn., Disp: 90 tablet, Rfl: 1   Vitamin D, Ergocalciferol, (DRISDOL) 1.25 MG (50000 UNIT) CAPS capsule, Take 50,000 Units by mouth once a week., Disp: , Rfl:   No Known Allergies ROS neg/noncontributory except as noted HPI/below      Objective:     LMP 04/14/2016 Comment: not sexually active Wt Readings from Last 3 Encounters:  09/21/22 152 lb (68.9 kg)  08/25/22 150 lb 4 oz (68.2 kg)  07/06/22 150 lb (68 kg)    Physical Exam   Gen: WDWN NAD HEENT: NCAT, conjunctiva not injected, sclera nonicteric NECK:  supple, no thyromegaly, no nodes, no carotid bruits CARDIAC: RRR, S1S2+, no murmur. DP 2+B LUNGS: CTAB. No wheezes ABDOMEN:  BS+,  soft, NTND, No HSM, no masses EXT:  no edema MSK: no gross abnormalities.  NEURO: A&O x3.  CN II-XII intact.  PSYCH: normal mood. Good eye contact     Assessment & Plan:  There are no diagnoses linked to this encounter.  No follow-ups on file.   Melina Fiddler N Rice,acting as a scribe for Angelena Sole, MD.,have documented all relevant documentation on the behalf of Angelena Sole, MD,as directed by  Angelena Sole, MD while in the presence of Angelena Sole, MD.  Juleen Starr, have reviewed all documentation for this visit. The documentation on 11/08/22 for the exam, diagnosis, procedures, and orders are all accurate and complete. ***  Anaiya N Rice

## 2022-11-09 ENCOUNTER — Other Ambulatory Visit: Payer: Self-pay | Admitting: Family Medicine

## 2022-11-09 ENCOUNTER — Ambulatory Visit (INDEPENDENT_AMBULATORY_CARE_PROVIDER_SITE_OTHER): Payer: 59 | Admitting: Family Medicine

## 2022-11-09 ENCOUNTER — Encounter: Payer: Self-pay | Admitting: Family Medicine

## 2022-11-09 VITALS — BP 112/70 | HR 77 | Temp 97.5°F | Resp 18 | Ht 64.0 in | Wt 151.0 lb

## 2022-11-09 DIAGNOSIS — I10 Essential (primary) hypertension: Secondary | ICD-10-CM | POA: Diagnosis not present

## 2022-11-09 DIAGNOSIS — G4709 Other insomnia: Secondary | ICD-10-CM | POA: Diagnosis not present

## 2022-11-09 DIAGNOSIS — E559 Vitamin D deficiency, unspecified: Secondary | ICD-10-CM

## 2022-11-09 LAB — VITAMIN D 25 HYDROXY (VIT D DEFICIENCY, FRACTURES): VITD: 52.43 ng/mL (ref 30.00–100.00)

## 2022-11-09 MED ORDER — DOXEPIN HCL 10 MG PO CAPS
10.0000 mg | ORAL_CAPSULE | Freq: Every evening | ORAL | 3 refills | Status: DC | PRN
Start: 2022-11-09 — End: 2022-12-05

## 2022-11-09 NOTE — Patient Instructions (Signed)

## 2022-11-09 NOTE — Progress Notes (Signed)
Vitamin D 52-suggest taking the vitamin d every other week

## 2022-11-15 ENCOUNTER — Other Ambulatory Visit: Payer: Self-pay | Admitting: Obstetrics & Gynecology

## 2022-11-15 DIAGNOSIS — Z79890 Hormone replacement therapy (postmenopausal): Secondary | ICD-10-CM

## 2022-11-15 NOTE — Telephone Encounter (Signed)
Medication refill request: prempro 0.451.5mg  Last AEX:  01-12-22 Next AEX: 01-17-23 Last MMG (if hormonal medication request): bilateral & axillary u/s 4/24 birads 1:neg Refill authorized: please approve if appropriate

## 2022-12-05 ENCOUNTER — Other Ambulatory Visit: Payer: Self-pay | Admitting: Family Medicine

## 2022-12-05 DIAGNOSIS — G4709 Other insomnia: Secondary | ICD-10-CM

## 2023-01-17 ENCOUNTER — Other Ambulatory Visit (HOSPITAL_COMMUNITY)
Admission: RE | Admit: 2023-01-17 | Discharge: 2023-01-17 | Disposition: A | Discharge status: Home / Self Care | Payer: 59 | Source: Ambulatory Visit | Attending: Obstetrics and Gynecology | Admitting: Obstetrics and Gynecology

## 2023-01-17 ENCOUNTER — Encounter: Payer: Self-pay | Admitting: Obstetrics and Gynecology

## 2023-01-17 ENCOUNTER — Ambulatory Visit (INDEPENDENT_AMBULATORY_CARE_PROVIDER_SITE_OTHER): Payer: 59 | Admitting: Obstetrics and Gynecology

## 2023-01-17 ENCOUNTER — Ambulatory Visit: Payer: 59 | Admitting: Obstetrics & Gynecology

## 2023-01-17 VITALS — BP 102/72 | HR 67 | Ht 64.5 in | Wt 153.0 lb

## 2023-01-17 DIAGNOSIS — Z01419 Encounter for gynecological examination (general) (routine) without abnormal findings: Secondary | ICD-10-CM | POA: Diagnosis not present

## 2023-01-17 DIAGNOSIS — Z1211 Encounter for screening for malignant neoplasm of colon: Secondary | ICD-10-CM

## 2023-01-17 DIAGNOSIS — N644 Mastodynia: Secondary | ICD-10-CM

## 2023-01-17 DIAGNOSIS — N898 Other specified noninflammatory disorders of vagina: Secondary | ICD-10-CM

## 2023-01-17 DIAGNOSIS — N951 Menopausal and female climacteric states: Secondary | ICD-10-CM

## 2023-01-17 DIAGNOSIS — Z79890 Hormone replacement therapy (postmenopausal): Secondary | ICD-10-CM

## 2023-01-17 DIAGNOSIS — E2839 Other primary ovarian failure: Secondary | ICD-10-CM

## 2023-01-17 MED ORDER — PREMPRO 0.45-1.5 MG PO TABS: 1.0000 | ORAL_TABLET | Freq: Every day | ORAL | 9 refills | Status: DC

## 2023-01-17 NOTE — Progress Notes (Signed)
57 y.o. y.o. female here for annual exam. She denies any PM bleeding.  Moved here from [place].  Patient's last menstrual period was 04/14/2016.  G1P1 Single. Not sexually active Reports left breast pain for 2 days Sister passed from NHL Pleased with her current dose of prempro Last mammogram: 4/24 birads 1 Last colonoscopy: cologuard 2022 Last pap smear 01/12/22 normal.  Reports h/o cryo and abnormal pap smears  Last menstrual period 04/14/2016.     Component Value Date/Time   DIAGPAP  01/12/2022 0857    - Negative for intraepithelial lesion or malignancy (NILM)   DIAGPAP  01/08/2021 1355    - Negative for intraepithelial lesion or malignancy (NILM)   DIAGPAP  05/12/2016 0000    NEGATIVE FOR INTRAEPITHELIAL LESIONS OR MALIGNANCY.   ADEQPAP  01/12/2022 0857    Satisfactory for evaluation; transformation zone component ABSENT.   ADEQPAP  01/08/2021 1355    Satisfactory for evaluation; transformation zone component PRESENT.   ADEQPAP  05/12/2016 0000    Satisfactory for evaluation  endocervical/transformation zone component ABSENT.    GYN HISTORY:    Component Value Date/Time   DIAGPAP  01/12/2022 0857    - Negative for intraepithelial lesion or malignancy (NILM)   DIAGPAP  01/08/2021 1355    - Negative for intraepithelial lesion or malignancy (NILM)   DIAGPAP  05/12/2016 0000    NEGATIVE FOR INTRAEPITHELIAL LESIONS OR MALIGNANCY.   ADEQPAP  01/12/2022 0857    Satisfactory for evaluation; transformation zone component ABSENT.   ADEQPAP  01/08/2021 1355    Satisfactory for evaluation; transformation zone component PRESENT.   ADEQPAP  05/12/2016 0000    Satisfactory for evaluation  endocervical/transformation zone component ABSENT.    OB History  Gravida Para Term Preterm AB Living  1 1 1     1   SAB IAB Ectopic Multiple Live Births               # Outcome Date GA Lbr Len/2nd Weight Sex Type Anes PTL Lv  1 Term             Past Medical History:   Diagnosis Date   Depression    situational- never on meds    Frequent headaches    Migraines    Onychomycosis 05/30/2018    Past Surgical History:  Procedure Laterality Date   none      Current Outpatient Medications on File Prior to Visit  Medication Sig Dispense Refill   B Complex Vitamins (B COMPLEX PO) Take by mouth.     betamethasone dipropionate 0.05 % cream Apply topically.     BORON PO      doxepin (SINEQUAN) 10 MG capsule TAKE 1 CAPSULE (10 MG TOTAL) BY MOUTH AT BEDTIME AS NEEDED. 90 capsule 1   MAGNESIUM PO      Multiple Vitamin (MULTIVITAMIN PO) Take by mouth.     PREMPRO 0.45-1.5 MG tablet TAKE 1 TABLET BY MOUTH DAILY 84 tablet 0   Probiotic Product (PROBIOTIC PO) Take by mouth.     valACYclovir (VALTREX) 1000 MG tablet Take one tablet po daily 3-5 days with outbreak, then daily prn. 90 tablet 1   Vitamin D, Ergocalciferol, (DRISDOL) 1.25 MG (50000 UNIT) CAPS capsule TAKE 1 CAPSULE BY MOUTH ONE TIME PER WEEK 12 capsule 3   No current facility-administered medications on file prior to visit.    Social History   Socioeconomic History   Marital status: Single    Spouse name: Not on file  Number of children: 1   Years of education: Not on file   Highest education level: Master's degree (e.g., MA, MS, MEng, MEd, MSW, MBA)  Occupational History   Not on file  Tobacco Use   Smoking status: Never   Smokeless tobacco: Never  Vaping Use   Vaping status: Never Used  Substance and Sexual Activity   Alcohol use: No   Drug use: No   Sexual activity: Not Currently    Birth control/protection: Post-menopausal, Abstinence    Comment: 1st intercourse- 13, partners- 4  Other Topics Concern   Not on file  Social History Narrative   Single. Lives alone. No pets in 01/23/20.       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    Social Determinants of Health   Financial Resource Strain: Low Risk  (06/21/2022)   Overall Financial Resource Strain (CARDIA)    Difficulty of Paying Living Expenses: Not hard at all  Food Insecurity: No Food Insecurity (06/21/2022)   Hunger Vital Sign    Worried About Running Out of Food in the Last Year: Never true    Ran Out of Food in the Last Year: Never true  Transportation Needs: No Transportation Needs (06/21/2022)   PRAPARE - Administrator, Civil Service (Medical): No    Lack of Transportation (Non-Medical): No  Physical Activity: Sufficiently Active (06/21/2022)   Exercise Vital Sign    Days of Exercise per Week: 4 days    Minutes of Exercise per Session: 40 min  Stress: Stress Concern Present (06/21/2022)   [employer] of Occupational Health - Occupational Stress Questionnaire    Feeling of Stress : To some extent  Social Connections: Unknown (06/21/2022)   Social Connection and Isolation Panel [NHANES]    Frequency of Communication with Friends and Family: More than three times a week    Frequency of Social Gatherings with Friends and Family: Once a week    Attends Religious Services: Never    Database administrator or Organizations: No    Attends Engineer, structural: Not on file    Marital Status: Patient declined  Intimate Partner Violence: Not on file    Family History  Problem Relation Age of Onset   Other Mother        died age 56 in 01-23-20. she gets med history on half siblings through mom   Heart disease Father 73 - 79   Dementia Father    Diabetes Father    Gout Father    Other Half-Brother        drunk driver   Healthy Half-Brother    Healthy Half-Brother    Lymphoma Half-Sister    Healthy Half-Sister    Healthy Half-Sister      No Known Allergies    Patient's last menstrual period was Patient's last menstrual period was 04/14/2016.Marland Kitchen            Review of Systems Alls systems reviewed and are negative.      Physical Exam Constitutional:      Appearance: Normal appearance.  Genitourinary:     Vulva and urethral meatus normal.     No lesions in the vagina.     Right Labia: No rash, lesions or skin changes.    Left Labia: No lesions, skin changes  or rash.    Vaginal discharge present.     No vaginal tenderness.     No vaginal prolapse present.    No vaginal atrophy present.     Right Adnexa: not tender, not palpable and no mass present.    Left Adnexa: not tender, not palpable and no mass present.    No cervical motion tenderness or discharge.     Uterus is not enlarged, tender or irregular.     Uterus is anteverted.  Breasts:    Right: Normal.     Left: Normal.  HENT:     Head: Normocephalic.  Neck:     Thyroid: No thyroid mass, thyromegaly or thyroid tenderness.  Cardiovascular:     Rate and Rhythm: Normal rate and regular rhythm.     Heart sounds: Normal heart sounds, S1 normal and S2 normal.  Pulmonary:     Effort: Pulmonary effort is normal.     Breath sounds: Normal breath sounds and air entry.  Abdominal:     General: There is no distension.     Palpations: Abdomen is soft. There is no mass.     Tenderness: There is no abdominal tenderness. There is no guarding or rebound.  Musculoskeletal:        General: Normal range of motion.     Cervical back: Full passive range of motion without pain, normal range of motion and neck supple. No tenderness.     Right lower leg: No edema.     Left lower leg: No edema.  Neurological:     Mental Status: She is alert.  Skin:    General: Skin is warm.  Psychiatric:        Mood and Affect: Mood normal.        Behavior: Behavior normal.        Thought Content: Thought content normal.  Vitals and nursing note reviewed. Exam conducted with a chaperone present.       A:         Well Woman GYN exam                             P:        Pap smear collected today.  History of abnormal pap smears Encouraged annual mammogram  screening.  To get left breast US to evaluate pain Colon cancer screening up-to-date DXA ordered today Labs and immunizations to do with PMD Discussed breast self exams Encouraged healthy lifestyle practices Encouraged Vit D and Calcium   No follow-ups on file.  Earley Favor

## 2023-01-17 NOTE — Addendum Note (Signed)
Addended by: Earley Favor on: 01/17/2023 04:21 PM   Modules accepted: Orders

## 2023-01-18 ENCOUNTER — Encounter: Payer: Self-pay | Admitting: Obstetrics and Gynecology

## 2023-01-18 ENCOUNTER — Other Ambulatory Visit: Payer: Self-pay | Admitting: Obstetrics and Gynecology

## 2023-01-18 DIAGNOSIS — Z79890 Hormone replacement therapy (postmenopausal): Secondary | ICD-10-CM

## 2023-01-18 DIAGNOSIS — N644 Mastodynia: Secondary | ICD-10-CM

## 2023-01-18 LAB — SURESWAB® ADVANCED VAGINITIS PLUS,TMA
C. trachomatis RNA, TMA: NOT DETECTED
CANDIDA SPECIES: NOT DETECTED
Candida glabrata: NOT DETECTED
N. gonorrhoeae RNA, TMA: NOT DETECTED
SURESWAB(R) ADV BACTERIAL VAGINOSIS(BV),TMA: NEGATIVE
TRICHOMONAS VAGINALIS (TV),TMA: NOT DETECTED

## 2023-01-18 MED ORDER — PREMPRO 0.45-1.5 MG PO TABS: 1.0000 | ORAL_TABLET | Freq: Every day | ORAL | 9 refills | Status: DC

## 2023-01-18 NOTE — Telephone Encounter (Signed)
Patient also left voicemail on triage line.  Declines colonoscopy, request cologuard order. Request prempro Rx to [facility].    Rx prempro pended to [facility].   Order pended for Cologuard.   Dr. Karma Greaser -please review.

## 2023-01-18 NOTE — Telephone Encounter (Signed)
Medication was refilled 02/17/23

## 2023-01-18 NOTE — Telephone Encounter (Signed)
Patient updated by MyChart message, see second MyChart message dated 01/18/23.

## 2023-01-19 ENCOUNTER — Encounter: Payer: Self-pay | Admitting: Obstetrics and Gynecology

## 2023-01-19 LAB — CYTOLOGY - PAP
Adequacy: ABSENT
Comment: NEGATIVE
Diagnosis: NEGATIVE
High risk HPV: NEGATIVE

## 2023-01-19 MED ORDER — INTRAROSA 6.5 MG VA INST: 1.0000 | VAGINAL_INSERT | Freq: Every evening | VAGINAL | 6 refills | Status: DC | PRN

## 2023-01-19 NOTE — Addendum Note (Signed)
Addended by: Earley Favor on: 01/19/2023 04:44 PM   Modules accepted: Orders

## 2023-01-19 NOTE — Telephone Encounter (Signed)
Dr. Karma Greaser -please send testosterone Rx to [facility] on file.

## 2023-01-19 NOTE — Telephone Encounter (Signed)
Dr. Karma Greaser -please advise on testosterone RX.

## 2023-01-24 ENCOUNTER — Encounter: Payer: Self-pay | Admitting: Obstetrics and Gynecology

## 2023-01-24 ENCOUNTER — Telehealth: Payer: Self-pay

## 2023-01-24 NOTE — Telephone Encounter (Signed)
Prior authorization for intrarosa 6.5mg  inserts was needed. Sent thru cover my meds. Key: [id number] Patient aware.   Covered alternatives may include Estradiol (no prior authorization needed) Osphena (prior authorization needed

## 2023-01-24 NOTE — Addendum Note (Signed)
Addended by: Jodelle Red D on: 01/24/2023 11:11 AM   Modules accepted: Orders

## 2023-01-24 NOTE — Telephone Encounter (Signed)
Joy -Did you receive anything from [facility] for an alternative request or PA?

## 2023-01-24 NOTE — Telephone Encounter (Signed)
Last cologuard 02/15/2021-neg. I believe this screening is good for 49yrs unless recommendation has changed. Let me know. Thanks.

## 2023-01-25 NOTE — Telephone Encounter (Signed)
The only thing I received was for a prior authorization for intrarosa 6.5mg . I sent it in thru [place] & notified the patient that we are waiting on a reply

## 2023-01-26 NOTE — Telephone Encounter (Signed)
Patient would like for you to send her a mychart message regarding DHEA being an option instead of intrarosa & testosterone.

## 2023-01-26 NOTE — Telephone Encounter (Signed)
Patient states she will wait on doing either rx of intrarosa or testosterone for now. She will decide after more conversation with Dr Karma Greaser.

## 2023-01-26 NOTE — Telephone Encounter (Signed)
Prior authorization was denied. Please see possible alternatives below & send in.

## 2023-02-10 ENCOUNTER — Other Ambulatory Visit: Payer: 59

## 2023-02-23 ENCOUNTER — Encounter: Payer: Self-pay | Admitting: Obstetrics and Gynecology

## 2023-02-28 ENCOUNTER — Encounter: Payer: Self-pay | Admitting: Obstetrics and Gynecology

## 2023-02-28 NOTE — Telephone Encounter (Signed)
Colonoscopy order/referral placed at the time of pt's AEX.   Pt had cologuard in 02/2021. Not due for any more screening until 02/2024.   Would you like for me to address this with pt or would you?  Let me know, thanks.

## 2023-03-05 ENCOUNTER — Encounter: Payer: Self-pay | Admitting: Obstetrics and Gynecology

## 2023-03-06 NOTE — Telephone Encounter (Signed)
Per review of EPIC, patient is scheduled for left breast Dx MMG and [place] on 03/15/23 for left breast pain.

## 2023-03-10 NOTE — Telephone Encounter (Signed)
Per EB:  "Thank you"  Encounter closed.

## 2023-03-15 ENCOUNTER — Other Ambulatory Visit: Payer: 59

## 2023-03-30 ENCOUNTER — Encounter: Payer: Self-pay | Admitting: Family Medicine

## 2023-03-30 ENCOUNTER — Ambulatory Visit (INDEPENDENT_AMBULATORY_CARE_PROVIDER_SITE_OTHER): Payer: 59 | Admitting: Family Medicine

## 2023-03-30 VITALS — BP 138/86 | HR 70 | Temp 97.8°F | Resp 18 | Ht 64.0 in | Wt 151.1 lb

## 2023-03-30 DIAGNOSIS — G4709 Other insomnia: Secondary | ICD-10-CM | POA: Diagnosis not present

## 2023-03-30 DIAGNOSIS — R030 Elevated blood-pressure reading, without diagnosis of hypertension: Secondary | ICD-10-CM

## 2023-03-30 DIAGNOSIS — E538 Deficiency of other specified B group vitamins: Secondary | ICD-10-CM

## 2023-03-30 DIAGNOSIS — E559 Vitamin D deficiency, unspecified: Secondary | ICD-10-CM | POA: Diagnosis not present

## 2023-03-30 DIAGNOSIS — E782 Mixed hyperlipidemia: Secondary | ICD-10-CM | POA: Diagnosis not present

## 2023-03-30 MED ORDER — DOXEPIN HCL 10 MG PO CAPS
10.0000 mg | ORAL_CAPSULE | Freq: Every evening | ORAL | 1 refills | Status: DC | PRN
Start: 2023-03-30 — End: 2023-05-11

## 2023-03-30 NOTE — Assessment & Plan Note (Signed)
Chronic.  Taking doxepin few x/wk but takes 2 of the 10mg  and working fine

## 2023-03-30 NOTE — Progress Notes (Signed)
Subjective:     Patient ID: Jill Price, female    DOB: 01-Mar-1966, 57 y.o.   MRN: [id number]  Chief Complaint  Patient presents with   Medical Management of Chronic Issues    6 month follow-up for blood work Not fasting     HPI  Elevated bp's resolved-not checking.  Stressed. Feels great.  No cp/sob, etc.  Elevated trigs-working on diet/exercise, etc.  Insomnia-taking doxepin 20mg  at a time but 2-3x/wk and working fine.    HRT-doing well on this dose of prempro-sleeping better.   Health Maintenance Due  Topic Date Due   DTaP/Tdap/Td  Never done    Past Medical History:  Diagnosis Date   Depression    situational- never on meds    Frequent headaches    Migraines    Onychomycosis 05/30/2018    Past Surgical History:  Procedure Laterality Date   none       Current Outpatient Medications:    B Complex Vitamins (B COMPLEX PO), Take by mouth., Disp: , Rfl:    betamethasone dipropionate 0.05 % cream, Apply topically., Disp: , Rfl:    BORON PO, , Disp: , Rfl:    estrogen, conjugated,-medroxyprogesterone (PREMPRO) 0.45-1.5 MG tablet, Take 1 tablet by mouth daily., Disp: 60 tablet, Rfl: 9   MAGNESIUM PO, , Disp: , Rfl:    Multiple Vitamin (MULTIVITAMIN PO), Take by mouth., Disp: , Rfl:    Prasterone (INTRAROSA) 6.5 MG INST, Place 1 suppository vaginally at bedtime as needed., Disp: 30 each, Rfl: 6   PREMPRO 0.45-1.5 MG tablet, TAKE 1 TABLET BY MOUTH DAILY, Disp: 84 tablet, Rfl: 0   Probiotic Product (PROBIOTIC PO), Take by mouth., Disp: , Rfl:    valACYclovir (VALTREX) 1000 MG tablet, Take one tablet po daily 3-5 days with outbreak, then daily prn., Disp: 90 tablet, Rfl: 1   Vitamin D, Ergocalciferol, (DRISDOL) 1.25 MG (50000 UNIT) CAPS capsule, TAKE 1 CAPSULE BY MOUTH ONE TIME PER WEEK, Disp: 12 capsule, Rfl: 3   doxepin (SINEQUAN) 10 MG capsule, Take 1 capsule (10 mg total) by mouth at bedtime as needed., Disp: 90 capsule, Rfl: 1  No Known Allergies ROS  neg/noncontributory except as noted HPI/below      Objective:     BP 138/86 (BP Location: Left Arm, Patient Position: Sitting, Cuff Size: Normal)   Pulse 70   Temp 97.8 F (36.6 C) (Temporal)   Resp 18   Ht 5\' 4"  (1.626 m)   Wt 151 lb 2 oz (68.5 kg)   LMP 04/14/2016 Comment: not sexually active  SpO2 99%   BMI 25.94 kg/m  Wt Readings from Last 3 Encounters:  03/30/23 151 lb 2 oz (68.5 kg)  01/17/23 153 lb (69.4 kg)  11/09/22 151 lb (68.5 kg)    Physical Exam   Gen: WDWN NAD HEENT: NCAT, conjunctiva not injected, sclera nonicteric NECK:  supple, no thyromegaly,+ 1cm R submand nodes(chronic per pt), no carotid bruits CARDIAC: RRR, S1S2+, no murmur. DP 2+B LUNGS: CTAB. No wheezes ABDOMEN:  BS+, soft, NTND, No HSM, no masses EXT:  no edema MSK: no gross abnormalities.  NEURO: A&O x3.  CN II-XII intact.  PSYCH: normal mood. Good eye contact     Assessment & Plan:  Mixed hyperlipidemia Assessment & Plan: Chronic.  Pt working on diet/exercise  Orders: -     Comprehensive metabolic panel; Future -     Lipid panel; Future -     TSH; Future -  Hemoglobin A1c; Future  Other insomnia Assessment & Plan: Chronic.  Taking doxepin few x/wk but takes 2 of the 10mg  and working fine  Orders: -     Doxepin HCl; Take 1 capsule (10 mg total) by mouth at bedtime as needed.  Dispense: 90 capsule; Refill: 1  B12 deficiency Assessment & Plan: Chronic.  On supps.  Check labs  Orders: -     Vitamin B12; Future  Vitamin D deficiency Assessment & Plan: Chronic.  On supps.  Check labs  Orders: -     VITAMIN D 25 Hydroxy (Vit-D Deficiency, Fractures); Future  Elevated blood pressure reading Assessment & Plan: Intermitt.  Working on diet/exercise  Orders: -     CBC with Differential/Platelet; Future -     Comprehensive metabolic panel; Future -     TSH; Future -     Hemoglobin A1c; Future    Return in about 6 months (around 09/28/2023) for annual physical.   soon  fasting labs.Angelena Sole, MD

## 2023-03-30 NOTE — Assessment & Plan Note (Signed)
Intermitt.  Working on diet/exercise

## 2023-03-30 NOTE — Patient Instructions (Signed)

## 2023-03-30 NOTE — Assessment & Plan Note (Signed)
 Chronic.  On supps.  Check labs

## 2023-03-30 NOTE — Assessment & Plan Note (Signed)
Chronic.  Pt working on diet/exercise

## 2023-03-31 ENCOUNTER — Other Ambulatory Visit (INDEPENDENT_AMBULATORY_CARE_PROVIDER_SITE_OTHER): Payer: 59

## 2023-03-31 DIAGNOSIS — E782 Mixed hyperlipidemia: Secondary | ICD-10-CM | POA: Diagnosis not present

## 2023-03-31 DIAGNOSIS — R030 Elevated blood-pressure reading, without diagnosis of hypertension: Secondary | ICD-10-CM | POA: Diagnosis not present

## 2023-03-31 DIAGNOSIS — E559 Vitamin D deficiency, unspecified: Secondary | ICD-10-CM

## 2023-03-31 DIAGNOSIS — E538 Deficiency of other specified B group vitamins: Secondary | ICD-10-CM | POA: Diagnosis not present

## 2023-03-31 LAB — COMPREHENSIVE METABOLIC PANEL
ALT: 16 U/L (ref 0–35)
AST: 20 U/L (ref 0–37)
Albumin: 4.1 g/dL (ref 3.5–5.2)
Alkaline Phosphatase: 73 U/L (ref 39–117)
BUN: 12 mg/dL (ref 6–23)
CO2: 26 meq/L (ref 19–32)
Calcium: 8.8 mg/dL (ref 8.4–10.5)
Chloride: 103 meq/L (ref 96–112)
Creatinine, Ser: 0.68 mg/dL (ref 0.40–1.20)
GFR: 95.51 mL/min (ref >60.00)
Glucose, Bld: 90 mg/dL (ref 70–99)
Potassium: 4.1 meq/L (ref 3.5–5.1)
Sodium: 137 meq/L (ref 135–145)
Total Bilirubin: 0.6 mg/dL (ref 0.2–1.2)
Total Protein: 6.9 g/dL (ref 6.0–8.3)

## 2023-03-31 LAB — LIPID PANEL
Cholesterol: 203 mg/dL — ABNORMAL HIGH (ref 0–200)
HDL: 82.9 mg/dL (ref >39.00)
LDL Cholesterol: 86 mg/dL (ref 0–99)
NonHDL: 120.59
Total CHOL/HDL Ratio: 2
Triglycerides: 174 mg/dL — ABNORMAL HIGH (ref 0.0–149.0)
VLDL: 34.8 mg/dL (ref 0.0–40.0)

## 2023-03-31 LAB — CBC WITH DIFFERENTIAL/PLATELET
Basophils Absolute: 0.1 10*3/uL (ref 0.0–0.1)
Basophils Relative: 0.8 % (ref 0.0–3.0)
Eosinophils Absolute: 0.3 10*3/uL (ref 0.0–0.7)
Eosinophils Relative: 3.9 % (ref 0.0–5.0)
HCT: 39.4 % (ref 36.0–46.0)
Hemoglobin: 13 g/dL (ref 12.0–15.0)
Lymphocytes Relative: 30.1 % (ref 12.0–46.0)
Lymphs Abs: 2.1 10*3/uL (ref 0.7–4.0)
MCHC: 32.9 g/dL (ref 30.0–36.0)
MCV: 87.4 fL (ref 78.0–100.0)
Monocytes Absolute: 0.4 10*3/uL (ref 0.1–1.0)
Monocytes Relative: 5.2 % (ref 3.0–12.0)
Neutro Abs: 4.2 10*3/uL (ref 1.4–7.7)
Neutrophils Relative %: 60 % (ref 43.0–77.0)
Platelets: 282 10*3/uL (ref 150.0–400.0)
RBC: 4.51 Mil/uL (ref 3.87–5.11)
RDW: 13.4 % (ref 11.5–15.5)
WBC: 7 10*3/uL (ref 4.0–10.5)

## 2023-03-31 LAB — VITAMIN B12: Vitamin B-12: 855 pg/mL (ref 211–911)

## 2023-03-31 LAB — VITAMIN D 25 HYDROXY (VIT D DEFICIENCY, FRACTURES): VITD: 51.58 ng/mL (ref 30.00–100.00)

## 2023-03-31 LAB — HEMOGLOBIN A1C: Hgb A1c MFr Bld: 6 % (ref 4.6–6.5)

## 2023-03-31 LAB — TSH: TSH: 2.13 u[IU]/mL (ref 0.35–5.50)

## 2023-04-02 NOTE — Progress Notes (Signed)
Labs stable.  Trigs still up but better.  Of course, continue diet/exercise A1C(3 month average of sugars) is elevated.  This is considered PreDiabetes.  Work on diet-decrease sugars and starches and aim for 30 minutes of exercise 5 days/week to prevent progression to diabetes

## 2023-04-03 ENCOUNTER — Encounter: Payer: Self-pay | Admitting: Family Medicine

## 2023-04-09 ENCOUNTER — Telehealth: Payer: 59 | Admitting: Physician Assistant

## 2023-04-09 DIAGNOSIS — I10 Essential (primary) hypertension: Secondary | ICD-10-CM | POA: Diagnosis not present

## 2023-04-09 NOTE — Progress Notes (Signed)
 Virtual Visit Consent   Jill Price, you are scheduled for a virtual visit with a [facility] provider today. Just as with appointments in the office, your consent must be obtained to participate. Your consent will be active for this visit and any virtual visit you may have with one of our providers in the next 365 days. If you have a MyChart account, a copy of this consent can be sent to you electronically.  As this is a virtual visit, video technology does not allow for your provider to perform a traditional examination. This may limit your provider's ability to fully assess your condition. If your provider identifies any concerns that need to be evaluated in person or the need to arrange testing (such as labs, EKG, etc.), we will make arrangements to do so. Although advances in technology are sophisticated, we cannot ensure that it will always work on either your end or our end. If the connection with a video visit is poor, the visit may have to be switched to a telephone visit. With either a video or telephone visit, we are not always able to ensure that we have a secure connection.  By engaging in this virtual visit, you consent to the provision of healthcare and authorize for your insurance to be billed (if applicable) for the services provided during this visit. Depending on your insurance coverage, you may receive a charge related to this service.  I need to obtain your verbal consent now. Are you willing to proceed with your visit today? Keyundra [name] has provided verbal consent on 04/09/2023 for a virtual visit (video or telephone). Kirk GORMAN Sage, PA-C  Date: 04/09/2023 12:13 PM  Virtual Visit via Video Note   I, Jill Price, connected with  Marzell Allemand  ([id number], [birth year]) on 04/09/23 at 12:00 PM EST by a video-enabled telemedicine application and verified that I am speaking with the correct person using two identifiers.  Location: Patient: Virtual Visit  Location Patient: Home Provider: Virtual Visit Location Provider: Home Office   I discussed the limitations of evaluation and management by telemedicine and the availability of in person appointments. The patient expressed understanding and agreed to proceed.    History of Present Illness: Jill Price is a 58 y.o. who identifies as a female who was assigned female at birth, and is being seen today with concerns about elevated blood pressure readings at home. She reported experiencing unusual discomfort behind the left ear and an episode of a blood vessel bursting in her eye, which was evaluated by an eye doctor. The eye doctor suggested that these symptoms could be due to a spike in blood pressure. The patient also reported significant stress due to recent unemployment, which she believes may be contributing to her elevated blood pressure.  Previously, the patient had been prescribed chlorothiazide for hypertension, but had not been taking it since July of the previous year. She was able to obtain this medication again after being seen at [facility] yesterday and had started taking it prior to the consultation. She also reported taking multivitamins, calcium, and medication for anxiety (hydroxyzine  which was prescribed yesterday at [facility]).  The patient denied experiencing dizziness, heart palpitations, or any other symptoms of hypertensive crisis. She also reported leading a generally healthy lifestyle, including regular exercise and a balanced diet, and denied any use of tobacco or alcohol.   States BP this morning was   148/94    Problems:  Patient Active Problem List  Diagnosis Date Noted   Mixed hyperlipidemia 03/30/2023   Other insomnia 03/30/2023   Elevated blood pressure reading 03/30/2023   Primary hypertension 08/25/2022   B12 deficiency 09/03/2021   Hormone replacement therapy (HRT) 09/03/2018   Weight gain 02/12/2018   Vitamin D  deficiency 08/03/2017   Bilateral hand pain  07/13/2017   Menopausal symptoms 07/13/2017    Allergies: No Known Allergies Medications:  Current Outpatient Medications:    B Complex Vitamins (B COMPLEX PO), Take by mouth., Disp: , Rfl:    betamethasone dipropionate 0.05 % cream, Apply topically., Disp: , Rfl:    BORON PO, , Disp: , Rfl:    doxepin  (SINEQUAN ) 10 MG capsule, Take 1 capsule (10 mg total) by mouth at bedtime as needed., Disp: 90 capsule, Rfl: 1   estrogen, conjugated,-medroxyprogesterone (PREMPRO ) 0.45-1.5 MG tablet, Take 1 tablet by mouth daily., Disp: 60 tablet, Rfl: 9   MAGNESIUM PO, , Disp: , Rfl:    Multiple Vitamin (MULTIVITAMIN PO), Take by mouth., Disp: , Rfl:    Prasterone  (INTRAROSA ) 6.5 MG INST, Place 1 suppository vaginally at bedtime as needed., Disp: 30 each, Rfl: 6   PREMPRO  0.45-1.5 MG tablet, TAKE 1 TABLET BY MOUTH DAILY, Disp: 84 tablet, Rfl: 0   Probiotic Product (PROBIOTIC PO), Take by mouth., Disp: , Rfl:    valACYclovir  (VALTREX ) 1000 MG tablet, Take one tablet po daily 3-5 days with outbreak, then daily prn., Disp: 90 tablet, Rfl: 1   Vitamin D , Ergocalciferol , (DRISDOL ) 1.25 MG (50000 UNIT) CAPS capsule, TAKE 1 CAPSULE BY MOUTH ONE TIME PER WEEK, Disp: 12 capsule, Rfl: 3  Observations/Objective: Patient is well-developed, well-nourished in no acute distress.  Resting comfortably at home.  Head is normocephalic, atraumatic.  No labored breathing.  Speech is clear and coherent with logical content.  Patient is alert and oriented at baseline.    Assessment and Plan: 1. Primary hypertension (Primary)  Patient reports stress-related hypertension, with elevated home BP readings . Patient has resumed taking previously prescribed Chlorthalidone . -Continue Chlorthalidone . -Check blood pressure twice daily over the next few days to monitor for improvement. -Bring home blood pressure cuff to next primary care appointment for calibration check.  Patient plans to schedule an appointment with primary  care provider within the week. -If unable to see primary care provider in a timely manner, consider visiting the mobile medicine unit..  Follow Up Instructions: I discussed the assessment and treatment plan with the patient. The patient was provided an opportunity to ask questions and all were answered. The patient agreed with the plan and demonstrated an understanding of the instructions.  A copy of instructions were sent to the patient via MyChart unless otherwise noted below.    The patient was advised to call back or seek an in-person evaluation if the symptoms worsen or if the condition fails to improve as anticipated.    Kirk RAMAN Mayers, PA-C

## 2023-04-09 NOTE — Patient Instructions (Signed)
 Jill Price, thank you for joining Kirk GORMAN Sage, PA-C for today's virtual visit.  While this provider is not your primary care provider (PCP), if your PCP is located in our provider database this encounter information will be shared with them immediately following your visit.   A [facility] MyChart account gives you access to today's visit and all your visits, tests, and labs performed at [facility]  click here if you don't have a [facility] MyChart account or go to [email or web address]  Consent: (Patient) Jill Price provided verbal consent for this virtual visit at the beginning of the encounter.  Current Medications:  Current Outpatient Medications:    B Complex Vitamins (B COMPLEX PO), Take by mouth., Disp: , Rfl:    betamethasone dipropionate 0.05 % cream, Apply topically., Disp: , Rfl:    BORON PO, , Disp: , Rfl:    doxepin  (SINEQUAN ) 10 MG capsule, Take 1 capsule (10 mg total) by mouth at bedtime as needed., Disp: 90 capsule, Rfl: 1   estrogen, conjugated,-medroxyprogesterone (PREMPRO ) 0.45-1.5 MG tablet, Take 1 tablet by mouth daily., Disp: 60 tablet, Rfl: 9   MAGNESIUM PO, , Disp: , Rfl:    Multiple Vitamin (MULTIVITAMIN PO), Take by mouth., Disp: , Rfl:    Prasterone  (INTRAROSA ) 6.5 MG INST, Place 1 suppository vaginally at bedtime as needed., Disp: 30 each, Rfl: 6   PREMPRO  0.45-1.5 MG tablet, TAKE 1 TABLET BY MOUTH DAILY, Disp: 84 tablet, Rfl: 0   Probiotic Product (PROBIOTIC PO), Take by mouth., Disp: , Rfl:    valACYclovir  (VALTREX ) 1000 MG tablet, Take one tablet po daily 3-5 days with outbreak, then daily prn., Disp: 90 tablet, Rfl: 1   Vitamin D , Ergocalciferol , (DRISDOL ) 1.25 MG (50000 UNIT) CAPS capsule, TAKE 1 CAPSULE BY MOUTH ONE TIME PER WEEK, Disp: 12 capsule, Rfl: 3   Medications ordered in this encounter:  No orders of the defined types were placed in this encounter.    *If you need refills on other medications prior to  your next appointment, please contact your pharmacy*  Follow-Up: Call back or seek an in-person evaluation if the symptoms worsen or if the condition fails to improve as anticipated.  [facility] [phone]  Other Instructions How to Take Your Blood Pressure Blood pressure is a measurement of how strongly your blood is pressing against the walls of your arteries. Arteries are blood vessels that carry blood from your heart throughout your body. Your health care provider takes your blood pressure at each office visit. You can also take your own blood pressure at home with a blood pressure monitor. You may need to take your own blood pressure to: Confirm a diagnosis of high blood pressure (hypertension). Monitor your blood pressure over time. Make sure your blood pressure medicine is working. Supplies needed: Blood pressure monitor. A chair to sit in. This should be a chair where you can sit upright with your back supported. Do not sit on a soft couch or an armchair. Table or desk. Small notebook and pencil or pen. How to prepare To get the most accurate reading, avoid the following for 30 minutes before you check your blood pressure: Drinking caffeine. Drinking alcohol. Eating. Smoking. Exercising. Five minutes before you check your blood pressure: Use the bathroom and urinate so that you have an empty bladder. Sit quietly in a chair. Do not talk. How to take your blood pressure To check your blood pressure, follow the instructions in the manual that  came with your blood pressure monitor. If you have a digital blood pressure monitor, the instructions may be as follows: Sit up straight in a chair. Place your feet on the floor. Do not cross your ankles or legs. Rest your left arm at the level of your heart on a table or desk or on the arm of a chair. Pull up your shirt sleeve. Wrap the blood pressure cuff around the upper part of your left arm, 1 inch (2.5 cm) above  your elbow. It is best to wrap the cuff around bare skin. Fit the cuff snugly, but not too tightly, around your arm. You should be able to place only one finger between the cuff and your arm. Position the cord so that it rests in the bend of your elbow. Press the power button. Sit quietly while the cuff inflates and deflates. Read the digital reading on the monitor screen and write the numbers down (record them) in a notebook. Wait 2-3 minutes, then repeat the steps, starting at step 1. What does my blood pressure reading mean? A blood pressure reading consists of a higher number over a lower number. Ideally, your blood pressure should be below 120/80. The first (top) number is called the systolic pressure. It is a measure of the pressure in your arteries as your heart beats. The second (bottom) number is called the diastolic pressure. It is a measure of the pressure in your arteries as the heart relaxes. Blood pressure is classified into four stages. The following are the stages for adults who do not have a short-term serious illness or a chronic condition. Systolic pressure and diastolic pressure are measured in a unit called mm Hg (millimeters of mercury).  Normal Systolic pressure: below 120. Diastolic pressure: below 80. Elevated Systolic pressure: 120-129. Diastolic pressure: below 80. Hypertension stage 1 Systolic pressure: 130-139. Diastolic pressure: 80-89. Hypertension stage 2 Systolic pressure: 140 or above. Diastolic pressure: 90 or above. You can have elevated blood pressure or hypertension even if only the systolic or only the diastolic number in your reading is higher than normal. Follow these instructions at home: Medicines Take over-the-counter and prescription medicines only as told by your health care provider. Tell your health care provider if you are having any side effects from blood pressure medicine. General instructions Check your blood pressure as often as  recommended by your health care provider. Check your blood pressure at the same time every day. Take your monitor to the next appointment with your health care provider to make sure that: You are using it correctly. It provides accurate readings. Understand what your goal blood pressure numbers are. Keep all follow-up visits. This is important. General tips Your health care provider can suggest a reliable monitor that will meet your needs. There are several types of home blood pressure monitors. Choose a monitor that has an arm cuff. Do not choose a monitor that measures your blood pressure from your wrist or finger. Choose a cuff that wraps snugly, not too tight or too loose, around your upper arm. You should be able to fit only one finger between your arm and the cuff. You can buy a blood pressure monitor at most drugstores or online. Where to find more information American Heart Association: www.heart.org Contact a health care provider if: Your blood pressure is consistently high. Your blood pressure is suddenly low. Get help right away if: Your systolic blood pressure is higher than 180. Your diastolic blood pressure is higher than 120. These symptoms  may be an emergency. Get help right away. Call 911. Do not wait to see if the symptoms will go away. Do not drive yourself to the hospital. Summary Blood pressure is a measurement of how strongly your blood is pressing against the walls of your arteries. A blood pressure reading consists of a higher number over a lower number. Ideally, your blood pressure should be below 120/80. Check your blood pressure at the same time every day. Avoid caffeine, alcohol, smoking, and exercise for 30 minutes prior to checking your blood pressure. These agents can affect the accuracy of the blood pressure reading. This information is not intended to replace advice given to you by your health care provider. Make sure you discuss any questions you have  with your health care provider. Document Revised: 12/03/2020 Document Reviewed: 12/03/2020 Elsevier Patient Education  2024 Elsevier Inc.    If you have been instructed to have an in-person evaluation today at a local Urgent Care facility, please use the link below. It will take you to a list of all of our available [facility], including address, phone number and hours of operation. Please do not delay care.  [facility]  If you or a family member do not have a primary care provider, use the link below to schedule a visit and establish care. When you choose a [facility] primary care physician or advanced practice provider, you gain a long-term partner in health. Find a Primary Care Provider  Learn more about [facility]'s in-office and virtual care options: [facility] - Get Care Now

## 2023-05-10 ENCOUNTER — Ambulatory Visit: Payer: 59 | Admitting: Family Medicine

## 2023-05-11 ENCOUNTER — Encounter: Payer: Self-pay | Admitting: Family Medicine

## 2023-05-11 ENCOUNTER — Ambulatory Visit (INDEPENDENT_AMBULATORY_CARE_PROVIDER_SITE_OTHER): Payer: Commercial Managed Care - PPO | Admitting: Family Medicine

## 2023-05-11 VITALS — BP 120/77 | HR 73 | Temp 97.5°F | Resp 18 | Ht 64.0 in | Wt 149.2 lb

## 2023-05-11 DIAGNOSIS — F419 Anxiety disorder, unspecified: Secondary | ICD-10-CM

## 2023-05-11 DIAGNOSIS — I10 Essential (primary) hypertension: Secondary | ICD-10-CM

## 2023-05-11 LAB — BASIC METABOLIC PANEL
BUN: 15 mg/dL (ref 6–23)
CO2: 27 meq/L (ref 19–32)
Calcium: 9.3 mg/dL (ref 8.4–10.5)
Chloride: 100 meq/L (ref 96–112)
Creatinine, Ser: 0.77 mg/dL (ref 0.40–1.20)
GFR: 84.53 mL/min (ref >60.00)
Glucose, Bld: 94 mg/dL (ref 70–99)
Potassium: 3.9 meq/L (ref 3.5–5.1)
Sodium: 136 meq/L (ref 135–145)

## 2023-05-11 MED ORDER — HYDROXYZINE HCL 25 MG PO TABS: 25.0000 mg | ORAL_TABLET | Freq: Every evening | ORAL | 1 refills | Status: AC | PRN

## 2023-05-11 NOTE — Patient Instructions (Signed)

## 2023-05-11 NOTE — Progress Notes (Signed)
 Subjective:     Patient ID: Jill Price, female    DOB: 22-May-1965, 58 y.o.   MRN: [id number]  Chief Complaint  Patient presents with   Follow-up    Follow-up from UC visit, medication refills Started back on heart medication     HPI Discussed the use of AI scribe software for clinical note transcription with the patient, who gave verbal consent to proceed.  History of Present Illness   Jill Price is a 58 year old female with hypertension who presents for follow-up after an urgent care visit for elevated blood pressure.  She visited urgent care on January 4th due to elevated blood pressure, measured at 168/unknown, after experiencing a subconjunctival hemorrhage on January 3rd. Her eye doctor suggested it could be related to a blood pressure spike, prompting her urgent care visit to prevent complications like stroke or heart attack.  At urgent care, she was prescribed hydroxyzine  for anxiety, which she takes at night instead of the recommended three times a day due to concerns about drowsiness and the need to remain functional during the day. Her anxiety has improved since the holidays, and her blood pressure has stabilized. She resumed taking chlorthalidone , a previously prescribed medication, at a half-pill daily dose, which she believes has helped maintain her blood pressure within a consistent range of 104-120/70s-80s. She is also taking a potassium supplement, and she drinks coconut water to maintain her potassium levels.  She was laid off in December, contributing to her stress and elevated blood pressure, but she is now actively seeking employment and feels her situation has improved since the holidays.  She monitors her blood pressure at home and notes slight differences between readings in her right and left arms, but these differences are minor. No headaches, dizziness, chest pain, shortness of breath, leg cramps, or suicidal thoughts. She remains active and  exercises regularly.       There are no preventive care reminders to display for this patient.  Past Medical History:  Diagnosis Date   Depression    situational- never on meds    Frequent headaches    Migraines    Onychomycosis 05/30/2018    Past Surgical History:  Procedure Laterality Date   none       Current Outpatient Medications:    B Complex Vitamins (B COMPLEX PO), Take by mouth., Disp: , Rfl:    chlorthalidone  (HYGROTON ) 25 MG tablet, Take 25 mg by mouth daily., Disp: , Rfl:    estrogen, conjugated,-medroxyprogesterone (PREMPRO ) 0.45-1.5 MG tablet, Take 1 tablet by mouth daily., Disp: 60 tablet, Rfl: 9   MAGNESIUM PO, , Disp: , Rfl:    Multiple Vitamin (MULTIVITAMIN PO), Take by mouth., Disp: , Rfl:    PREMPRO  0.45-1.5 MG tablet, TAKE 1 TABLET BY MOUTH DAILY, Disp: 84 tablet, Rfl: 0   valACYclovir  (VALTREX ) 1000 MG tablet, Take one tablet po daily 3-5 days with outbreak, then daily prn., Disp: 90 tablet, Rfl: 1   Vitamin D , Ergocalciferol , (DRISDOL ) 1.25 MG (50000 UNIT) CAPS capsule, TAKE 1 CAPSULE BY MOUTH ONE TIME PER WEEK, Disp: 12 capsule, Rfl: 3   hydrOXYzine  (ATARAX ) 25 MG tablet, Take 1 tablet (25 mg total) by mouth at bedtime as needed., Disp: 90 tablet, Rfl: 1  No Known Allergies ROS neg/noncontributory except as noted HPI/below      Objective:     BP 120/77   Pulse 73   Temp (!) 97.5 F (36.4 C) (Temporal)   Resp 18  Ht 5' 4 (1.626 m)   Wt 149 lb 4 oz (67.7 kg)   LMP 04/14/2016 Comment: not sexually active  SpO2 99%   BMI 25.62 kg/m  Wt Readings from Last 3 Encounters:  05/11/23 149 lb 4 oz (67.7 kg)  03/30/23 151 lb 2 oz (68.5 kg)  01/17/23 153 lb (69.4 kg)    Physical Exam   Gen: WDWN NAD HEENT: NCAT, conjunctiva not injected, sclera nonicteric NECK:  supple, no thyromegaly, no nodes, no carotid bruits CARDIAC: RRR, S1S2+, no murmur. DP 2+B LUNGS: CTAB. No wheezes EXT:  no edema MSK: no gross abnormalities.  NEURO: A&O x3.  CN  II-XII intact.  PSYCH: normal mood. Good eye contact  Reviewed uc notes    Assessment & Plan:  Primary hypertension -     Basic metabolic panel  Anxiety -     hydrOXYzine  HCl; Take 1 tablet (25 mg total) by mouth at bedtime as needed.  Dispense: 90 tablet; Refill: 1  Assessment and Plan    Hypertension Presents with elevated blood pressure, initially 168/?. Previously seen in urgent care for subconjunctival hemorrhage and elevated blood pressure. Currently taking hydroxyzine  for anxiety and chlorthalidone , half a pill daily, which stabilizes blood pressure to 104-120/70-80. No symptoms of headache, dizziness, chest pain, shortness of breath, or leg cramps. Long-term medication use may be necessary due to age and genetic factors. Monitoring kidney function and potassium levels is important due to chlorthalidone  use. Continue chlorthalidone  12.5 mg daily. Order BMP to monitor kidney function and potassium levels. Follow-up in 6 months for blood pressure and kidney function assessment. continue diet/exercise  Anxiety Experiences anxiety related to recent job loss and holiday stress, contributing to elevated blood pressure. Hydroxyzine  is helpful but causes drowsiness, limiting its use to once daily at bedtime. Discussed risks of drowsiness and advised against driving or operating heavy machinery while on the medication. Prescribe hydroxyzine  25 mg, 1 tablet at bedtime, 90 tablets with 1 refill.  General Health Maintenance Proactive about health, taking multiple supplements including potassium, coconut water for electrolytes, and other vitamins. Physically active and maintains a healthy lifestyle. Emphasized the importance of monitoring potassium levels due to chlorthalidone  use. Continue current supplement regimen. Monitor potassium levels with BMP. Encourage continued physical activity and healthy lifestyle.  Follow-up Schedule a follow-up appointment in 6 months. Immediate follow-up if  abnormal lab results or symptoms occur.        Return in about 6 months (around 11/08/2023) for HTN.  Jenkins CHRISTELLA Carrel, MD

## 2023-05-11 NOTE — Progress Notes (Signed)
 Labs ok.

## 2023-06-16 ENCOUNTER — Encounter: Payer: Self-pay | Admitting: Family Medicine

## 2023-07-05 ENCOUNTER — Encounter: Payer: Self-pay | Admitting: Family Medicine

## 2023-07-05 ENCOUNTER — Ambulatory Visit (INDEPENDENT_AMBULATORY_CARE_PROVIDER_SITE_OTHER): Admitting: Sports Medicine

## 2023-07-05 ENCOUNTER — Ambulatory Visit: Payer: Self-pay

## 2023-07-05 DIAGNOSIS — M549 Dorsalgia, unspecified: Secondary | ICD-10-CM

## 2023-07-05 DIAGNOSIS — M542 Cervicalgia: Secondary | ICD-10-CM
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
## 2023-07-17 ENCOUNTER — Encounter: Payer: Self-pay | Admitting: Obstetrics and Gynecology

## 2023-07-17 DIAGNOSIS — Z79890 Hormone replacement therapy (postmenopausal): Secondary | ICD-10-CM

## 2023-07-18 MED ORDER — PREMPRO 0.45-1.5 MG PO TABS: 1.0000 | ORAL_TABLET | Freq: Every day | ORAL | 0 refills | Status: DC

## 2023-07-18 NOTE — Telephone Encounter (Signed)
 Med refill request: Prempro  Last AEX: 01/17/23  Next AEX: 01/22/24  Last MMG (if hormonal med) 07/27/22 birads cat 1 neg  Refill authorized: Please Advise? Last rx 01/18/23 #60 with 9 refills. Patient is requesting a 90 day supply due to insurance change. Please approve or deny

## 2023-07-28 ENCOUNTER — Other Ambulatory Visit: Payer: Self-pay | Admitting: Family Medicine

## 2023-07-31 ENCOUNTER — Ambulatory Visit (INDEPENDENT_AMBULATORY_CARE_PROVIDER_SITE_OTHER): Admitting: Family Medicine

## 2023-07-31 ENCOUNTER — Encounter: Payer: Self-pay | Admitting: Family Medicine

## 2023-07-31 VITALS — BP 129/67 | HR 79 | Temp 97.6°F | Resp 18 | Ht 64.25 in | Wt 151.5 lb

## 2023-07-31 DIAGNOSIS — M542 Cervicalgia: Secondary | ICD-10-CM | POA: Diagnosis not present

## 2023-07-31 DIAGNOSIS — R030 Elevated blood-pressure reading, without diagnosis of hypertension: Secondary | ICD-10-CM | POA: Diagnosis not present

## 2023-07-31 DIAGNOSIS — Z79899 Other long term (current) drug therapy: Secondary | ICD-10-CM

## 2023-07-31 LAB — BASIC METABOLIC PANEL WITH GFR
BUN: 16 mg/dL (ref 6–23)
CO2: 28 meq/L (ref 19–32)
Calcium: 9.7 mg/dL (ref 8.4–10.5)
Chloride: 98 meq/L (ref 96–112)
Creatinine, Ser: 0.83 mg/dL (ref 0.40–1.20)
GFR: 77.13 mL/min (ref >60.00)
Glucose, Bld: 117 mg/dL — ABNORMAL HIGH (ref 70–99)
Potassium: 3.5 meq/L (ref 3.5–5.1)
Sodium: 136 meq/L (ref 135–145)

## 2023-07-31 LAB — MICROALBUMIN / CREATININE URINE RATIO
Creatinine,U: 59.8 mg/dL
Microalb Creat Ratio: UNDETERMINED mg/g (ref 0.0–30.0)
Microalb, Ur: <0.7 mg/dL

## 2023-07-31 NOTE — Progress Notes (Signed)
 Urine fine Potassium barely normal.  Does she want rx potassium 10meq daily, or just eat more potassium rich foods?  Suspct will be back to normal when stops the chlorthalidone 
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
## 2023-07-31 NOTE — Patient Instructions (Signed)
 Can take aleve twice daily for pain.  Also, can add tylenol  Monitor bp as you are  Congrats!  And good luck.  Sorry to see you go, but understand

## 2023-08-01 ENCOUNTER — Other Ambulatory Visit: Payer: Self-pay | Admitting: *Deleted

## 2023-08-01 MED ORDER — POTASSIUM CHLORIDE CRYS ER 10 MEQ PO TBCR: 10.0000 meq | EXTENDED_RELEASE_TABLET | Freq: Every day | ORAL | 0 refills | Status: AC

## 2023-08-01 MED ORDER — POTASSIUM CHLORIDE CRYS ER 10 MEQ PO TBCR: 10.0000 meq | EXTENDED_RELEASE_TABLET | Freq: Two times a day (BID) | ORAL | 0 refills | Status: DC

## 2023-08-01 NOTE — Addendum Note (Signed)
 Addended by: Albertine Alpha on: 08/01/2023 02:47 PM   Modules accepted: Orders

## 2023-08-02 ENCOUNTER — Ambulatory Visit (INDEPENDENT_AMBULATORY_CARE_PROVIDER_SITE_OTHER): Admitting: Sports Medicine

## 2023-08-02 VITALS — BP 132/84 | Ht 64.25 in | Wt 150.0 lb

## 2023-08-02 DIAGNOSIS — M7918 Myalgia, other site: Secondary | ICD-10-CM

## 2023-08-02 NOTE — Patient Instructions (Signed)
 You were seen today for neck and trap pain. This is reflective of myofascial trigger points in the right trapezius. This is typically something that goes away with time and not reflective of any major damage or issues in your neck.  I recommend you continue heating pad use as needed and massage. We have also sent a referral to [facility] in the [facility]. They will work on stretching and isometric exercises and may introduce dry needling and TENS unit use as well.  If you are still having difficulty with this in 6-8 weeks follow-up with us .
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
## 2023-08-09 ENCOUNTER — Other Ambulatory Visit: Payer: 59

## 2023-08-11 ENCOUNTER — Other Ambulatory Visit: Payer: Self-pay | Admitting: *Deleted

## 2023-08-11 ENCOUNTER — Encounter: Payer: Self-pay | Admitting: Family Medicine

## 2023-08-11 DIAGNOSIS — E559 Vitamin D deficiency, unspecified: Secondary | ICD-10-CM

## 2023-08-11 MED ORDER — VITAMIN D (ERGOCALCIFEROL) 1.25 MG (50000 UNIT) PO CAPS: 50000.0000 [IU] | ORAL_CAPSULE | ORAL | 0 refills | Status: AC

## 2023-08-11 MED ORDER — CHLORTHALIDONE 25 MG PO TABS: 25.0000 mg | ORAL_TABLET | Freq: Every day | ORAL | 1 refills | Status: AC

## 2023-08-14 ENCOUNTER — Encounter: Payer: Self-pay | Admitting: Obstetrics and Gynecology

## 2023-08-24 ENCOUNTER — Encounter: Payer: 59 | Admitting: Family Medicine

## 2023-09-12 ENCOUNTER — Encounter: Payer: Self-pay | Admitting: Family Medicine

## 2023-09-16 LAB — COLOGUARD: COLOGUARD: NEGATIVE

## 2023-09-19 ENCOUNTER — Telehealth: Payer: Self-pay | Admitting: *Deleted

## 2023-09-19 DIAGNOSIS — N644 Mastodynia: Secondary | ICD-10-CM

## 2023-09-19 DIAGNOSIS — E2839 Other primary ovarian failure: Secondary | ICD-10-CM

## 2023-09-19 NOTE — Telephone Encounter (Signed)
-----   Message from Reinaldo Caras sent at 09/15/2023  1:46 PM EDT ----- Can we call her. The breast US , and dxa were sent to breast imaging and I don't see it scheduled.  Does she need another referral, since they stopped? Dr. Tia Flowers

## 2023-09-22 NOTE — Telephone Encounter (Signed)
 Call placed to patient. Patient questioned why she had to provide DOB for verification, explanation provided.   Patient frustrated that our office is calling with recommendations, expressed she did not appreciate my demeanor. I explained that I was calling to provide recommendations from Dr. Tia Flowers would need to confirm DOB to provide or discuss medical information. Patient states she is no longer a patient of [facility], wants to know what the recommendations are, advised she would need to confirm DOB. Patient confirmed DOB. I advised Dr. Tia Flowers may not be aware that you have established care with another provider, advised of recommendations as seen below. Patient states she had imaging through another health system, does not want a call back, orders or prescriptions sent. Advised I will cancel all orders, appts or recalls. Patient states nothing was ever ordered. Patient ended call.   All future appointments cancelled.  Orders cancelled.  Recall removed.   See MMG results in Care Everywhere under [facility].   Routing to provider for final review.  Will close encounter.  Cc: Miranda

## 2023-10-15 ENCOUNTER — Other Ambulatory Visit: Payer: Self-pay | Admitting: Radiology

## 2023-10-15 DIAGNOSIS — Z79890 Hormone replacement therapy (postmenopausal): Secondary | ICD-10-CM

## 2023-10-16 ENCOUNTER — Telehealth: Payer: Self-pay | Admitting: Obstetrics and Gynecology

## 2023-10-16 DIAGNOSIS — E2839 Other primary ovarian failure: Secondary | ICD-10-CM

## 2023-10-16 NOTE — Telephone Encounter (Addendum)
 Med refill request: Prempro  0.45-1.5 mg tabs Last AEX:  01/17/23 Next AEX:  01/2024 was cancelled, has not rescheduled Last MMG (if hormonal med) Refill authorized: Please Advise?

## 2023-10-16 NOTE — Telephone Encounter (Signed)
 Referral for dxa. Last one for breast center and they have stopped.

## 2023-11-07 IMAGING — MG MM DIGITAL SCREENING BILAT W/ TOMO AND CAD
8 series · 8 of 24 positions shown · non-contrast
Comparison: Previous exam(s).

CLINICAL DATA: Screening.

EXAM:
DIGITAL SCREENING BILATERAL MAMMOGRAM WITH TOMOSYNTHESIS AND CAD
TECHNIQUE: Bilateral screening digital craniocaudal and mediolateral oblique
mammograms were obtained. Bilateral screening digital breast
tomosynthesis was performed. The images were evaluated with
computer-aided detection.

[R CC synth-2D]
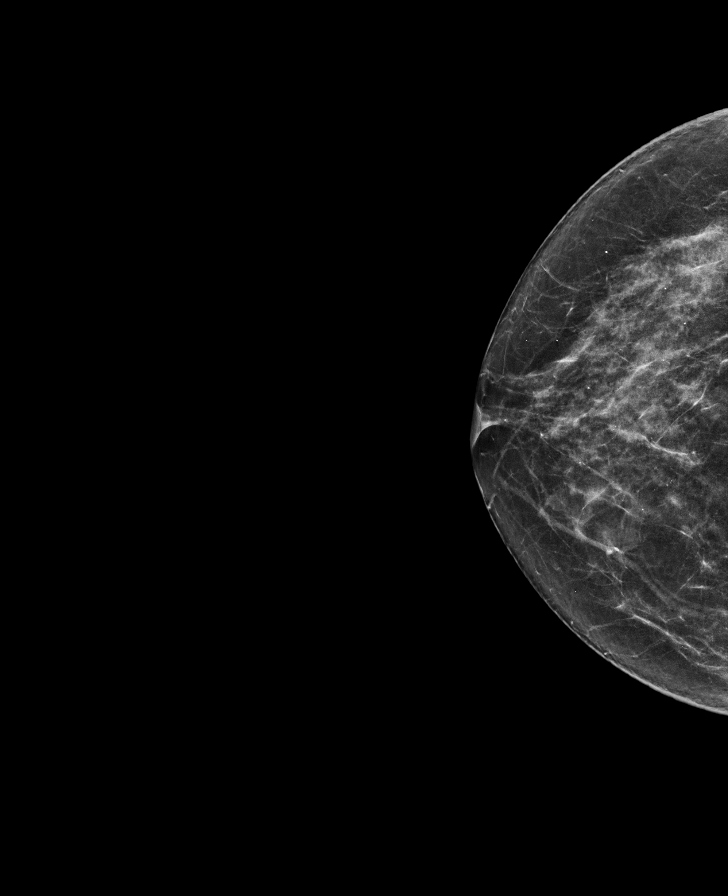

[R MLO synth-2D]
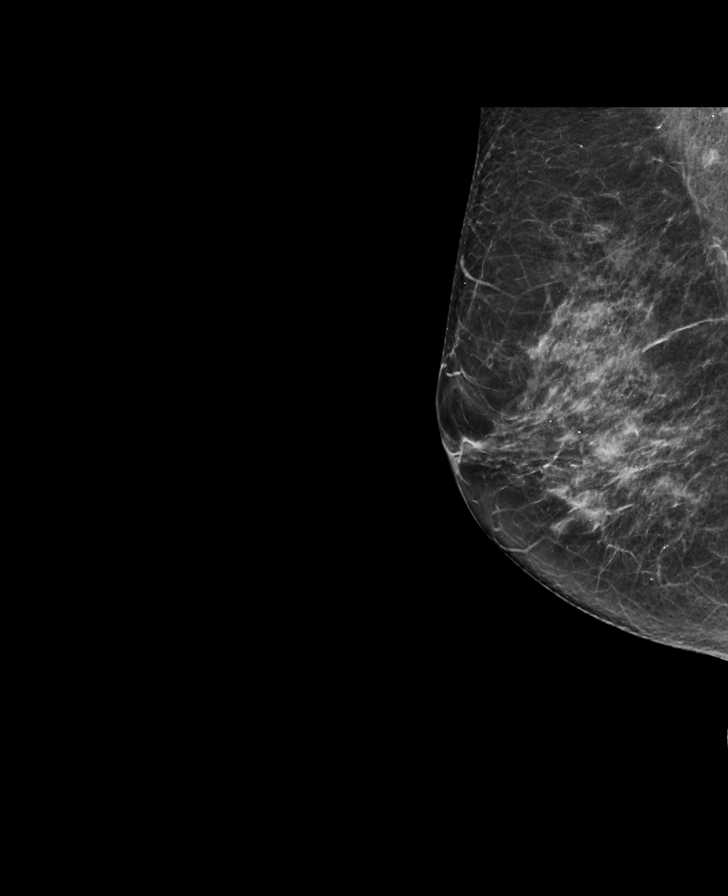

[L CC synth-2D]
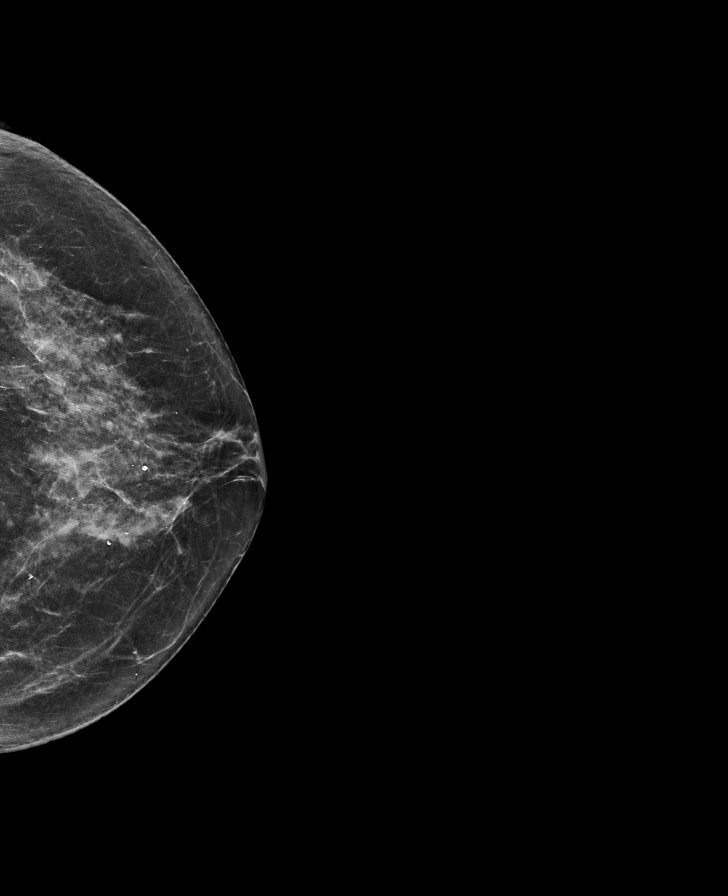

[L MLO synth-2D]
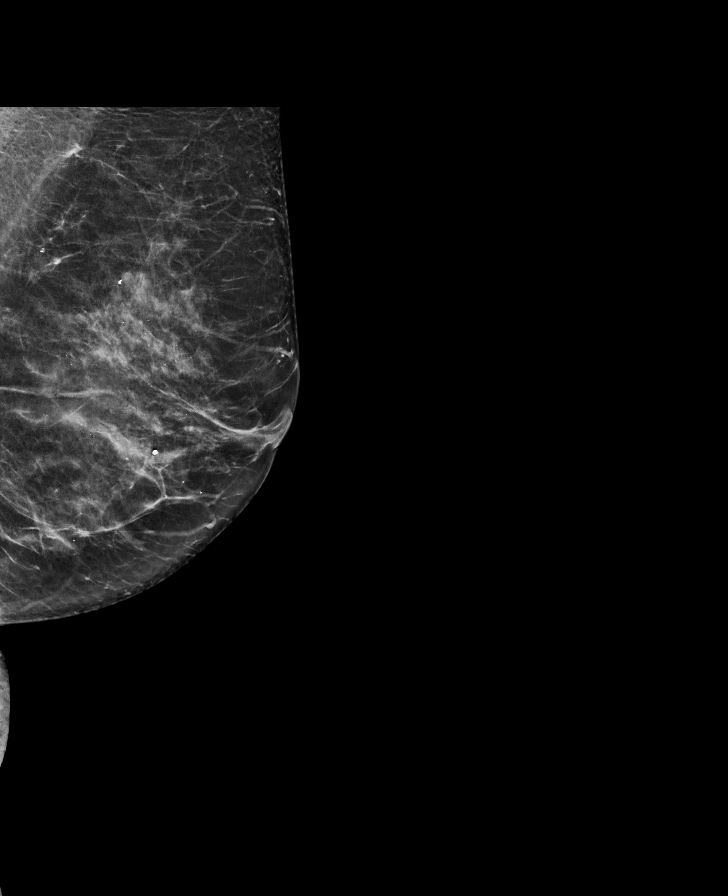

[R MLO tomo · tomo slice 35/68.0]
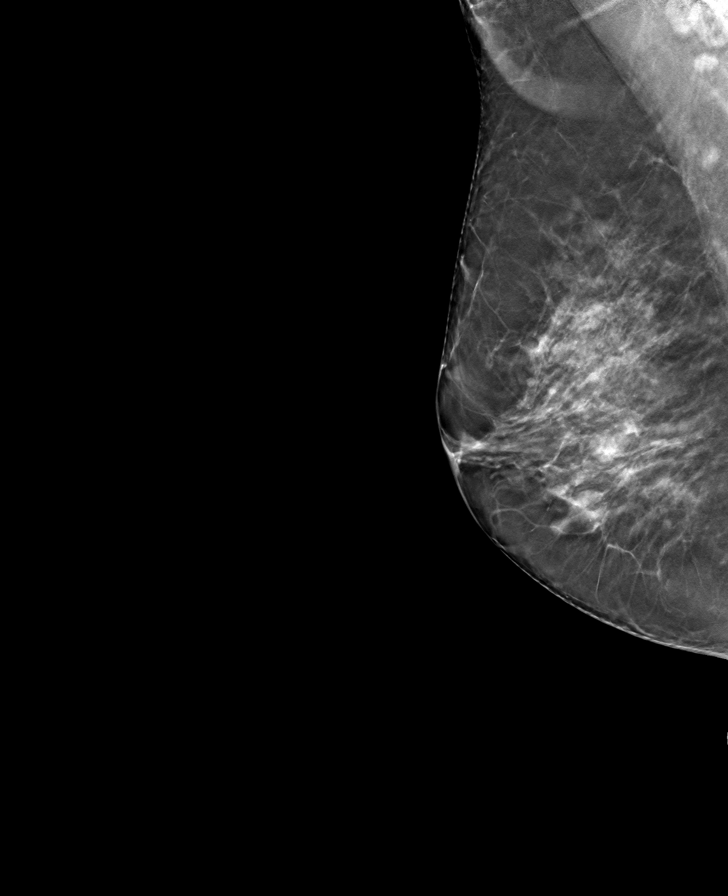

[R CC tomo · tomo slice 34/67.0]
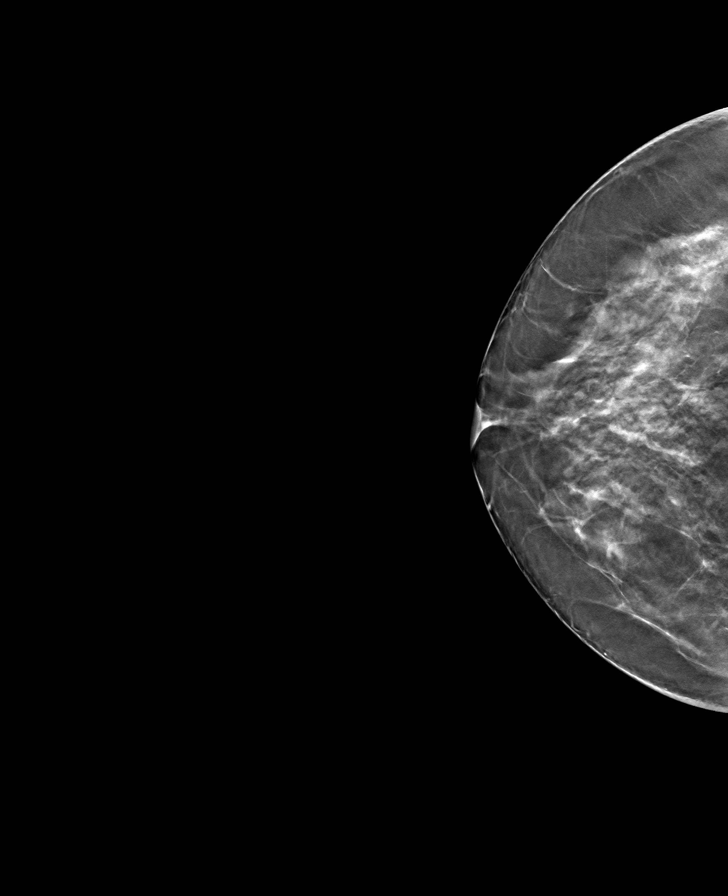

[L CC tomo · tomo slice 34/67.0]
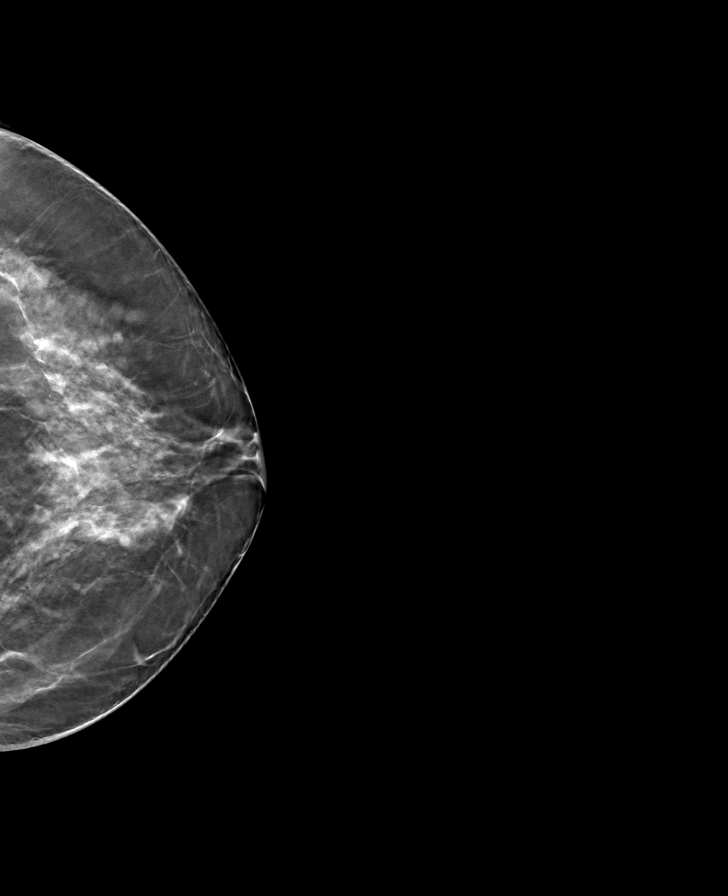

[L MLO tomo · tomo slice 35/69.0]
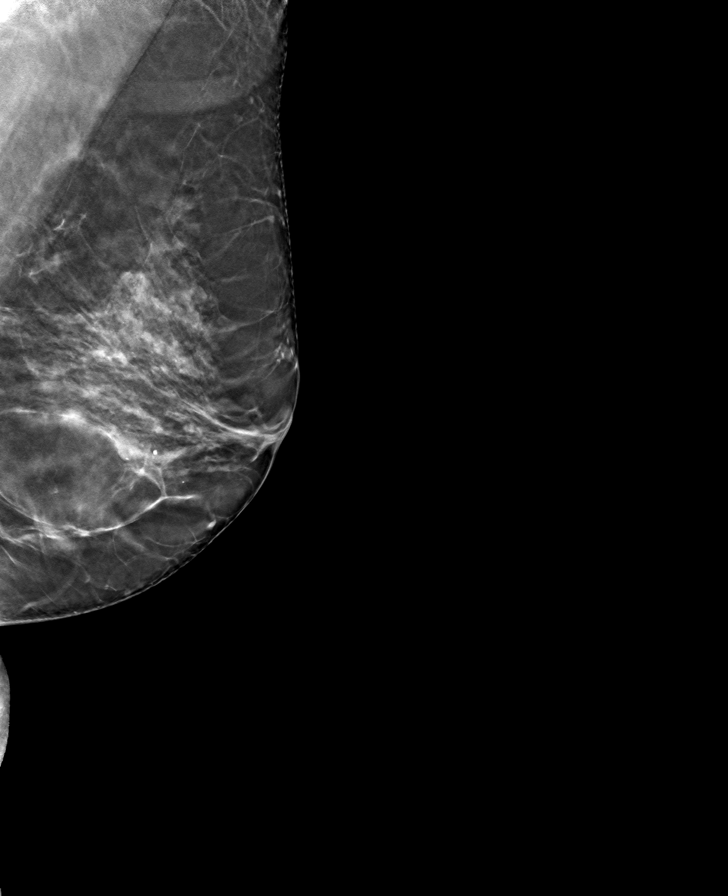

[8 of 24 positions shown; findings below may reference images not displayed]

ACR Breast Density Category c: The breast tissue is heterogeneously
dense, which may obscure small masses.
FINDINGS: There are no findings suspicious for malignancy.
IMPRESSION: No mammographic evidence of malignancy. A result letter of this
screening mammogram will be mailed directly to the patient.

RECOMMENDATION:
Screening mammogram in one year. (Code:Q3-W-BC3)

BI-RADS CATEGORY  1: Negative.

## 2023-11-08 ENCOUNTER — Ambulatory Visit: Payer: Commercial Managed Care - PPO | Admitting: Family Medicine

## 2024-01-22 ENCOUNTER — Ambulatory Visit: Payer: 59 | Admitting: Obstetrics and Gynecology
